# Patient Record
Sex: Female | Born: 1950 | Race: White | Hispanic: No | Smoking: Current every day smoker
Health system: Southern US, Community
[De-identification: ages and names within clinical notes are randomized; demographics above are authoritative.]

## PROBLEM LIST (undated history)

## (undated) DIAGNOSIS — Z8719 Personal history of other diseases of the digestive system: Secondary | ICD-10-CM

## (undated) DIAGNOSIS — G8929 Other chronic pain: Secondary | ICD-10-CM

## (undated) DIAGNOSIS — I2119 ST elevation (STEMI) myocardial infarction involving other coronary artery of inferior wall: Secondary | ICD-10-CM

## (undated) DIAGNOSIS — T4145XA Adverse effect of unspecified anesthetic, initial encounter: Secondary | ICD-10-CM

## (undated) DIAGNOSIS — M25559 Pain in unspecified hip: Secondary | ICD-10-CM

## (undated) DIAGNOSIS — I739 Peripheral vascular disease, unspecified: Secondary | ICD-10-CM

## (undated) DIAGNOSIS — T8859XA Other complications of anesthesia, initial encounter: Secondary | ICD-10-CM

## (undated) DIAGNOSIS — I2510 Atherosclerotic heart disease of native coronary artery without angina pectoris: Secondary | ICD-10-CM

## (undated) DIAGNOSIS — E7800 Pure hypercholesterolemia, unspecified: Secondary | ICD-10-CM

## (undated) DIAGNOSIS — R0789 Other chest pain: Secondary | ICD-10-CM

## (undated) DIAGNOSIS — R0602 Shortness of breath: Secondary | ICD-10-CM

## (undated) DIAGNOSIS — F419 Anxiety disorder, unspecified: Secondary | ICD-10-CM

## (undated) DIAGNOSIS — H269 Unspecified cataract: Secondary | ICD-10-CM

## (undated) DIAGNOSIS — I10 Essential (primary) hypertension: Secondary | ICD-10-CM

## (undated) DIAGNOSIS — K219 Gastro-esophageal reflux disease without esophagitis: Secondary | ICD-10-CM

## (undated) DIAGNOSIS — M1990 Unspecified osteoarthritis, unspecified site: Secondary | ICD-10-CM

## (undated) HISTORY — PX: BREAST SURGERY: SHX581

## (undated) HISTORY — PX: APPENDECTOMY: SHX54

## (undated) HISTORY — PX: TONSILLECTOMY: SUR1361

## (undated) HISTORY — PX: CATARACT EXTRACTION: SUR2

## (undated) HISTORY — PX: TUBAL LIGATION: SHX77

## (undated) HISTORY — DX: Atherosclerotic heart disease of native coronary artery without angina pectoris: I25.10

## (undated) HISTORY — PX: CARDIAC CATHETERIZATION: SHX172

## (undated) HISTORY — PX: VULVECTOMY: SHX1086

---

## 1999-04-08 ENCOUNTER — Other Ambulatory Visit: Admission: RE | Admit: 1999-04-08 | Discharge: 1999-04-08 | Payer: Self-pay | Admitting: Obstetrics and Gynecology

## 2006-07-13 ENCOUNTER — Ambulatory Visit (HOSPITAL_COMMUNITY): Admission: RE | Admit: 2006-07-13 | Discharge: 2006-07-13 | Payer: Self-pay | Admitting: Pediatrics

## 2006-08-08 ENCOUNTER — Emergency Department (HOSPITAL_COMMUNITY): Admission: EM | Admit: 2006-08-08 | Discharge: 2006-08-09 | Payer: Self-pay | Admitting: Emergency Medicine

## 2006-08-11 ENCOUNTER — Ambulatory Visit: Payer: Self-pay | Admitting: Cardiology

## 2006-09-20 ENCOUNTER — Ambulatory Visit: Payer: Self-pay | Admitting: Cardiology

## 2006-09-20 LAB — CONVERTED CEMR LAB
ALT: 21 units/L (ref 0–40)
AST: 16 units/L (ref 0–37)
Bilirubin, Direct: 0.1 mg/dL (ref 0.0–0.3)
Cholesterol: 244 mg/dL (ref 0–200)
Total Protein: 6.3 g/dL (ref 6.0–8.3)

## 2006-09-22 ENCOUNTER — Ambulatory Visit: Payer: Self-pay | Admitting: Cardiology

## 2007-03-15 ENCOUNTER — Ambulatory Visit: Payer: Self-pay | Admitting: Internal Medicine

## 2008-03-26 ENCOUNTER — Encounter: Admission: RE | Admit: 2008-03-26 | Discharge: 2008-03-26 | Payer: Self-pay | Admitting: Gastroenterology

## 2009-04-07 ENCOUNTER — Encounter: Admission: RE | Admit: 2009-04-07 | Discharge: 2009-04-07 | Payer: Self-pay | Admitting: Internal Medicine

## 2011-01-21 NOTE — Assessment & Plan Note (Signed)
[facility]ALTHCARE                            CARDIOLOGY OFFICE NOTE   Kristina, Ward                       MRN:          [id number]  DATE:09/22/2006                            DOB:          1951/08/18    CLINICAL HISTORY:  [name] is 60 years old and returned for follow up,  management of hypertension and hyperlipidemia.  I saw [name] the first time  in December on referral from [facility] Emergency Department for  __________ [name] was started on metoprolol at that time.   When I saw [name] [name] had [name] had a markedly positive risk profile for  vascular disease.  [name] is a smoker with hypertension, hyperlipidemia and  a strong family history for coronary heart disease.   After some __________ .   We continued [name] on [name] Toprol, added aspirin __________ .   PAST MEDICAL HISTORY:  Significant for:  1. Hyperlipidemia.  2. Appendectomy.  3. Tonsillectomy.  4. Tubal ligation.   CURRENT MEDICATIONS:  Metoprolol 50 mg daily.  __________ .   SOCIAL HISTORY:  [name] is single and has 3 children.  [name] works in  [employer]  __________ .   PHYSICAL EXAMINATION:  The blood pressure was 120/80, the pulse 72 and  regular.  There was no vein distention.  Carotid pulses were full.  CHEST:  Clear.  CARDIAC RHYTHM:  Regular.  HEART SOUNDS:  Normal, there were no murmurs or gallops.  ABDOMEN:  Soft with normal bowel sounds.  There was no  hepatosplenomegaly.  Peripheral pulses were full, there was no  peripheral edema.   IMPRESSION:  1. Hypertension, now under good control.  2. Hyperlipidemia, not well controlled.  3. Chronic smoker.  4. Positive family history of coronary heart disease.  5. __________ left arm pain and numbness __________ .   RECOMMENDATIONS:  Will plan to continue Ms. Coke on Toprol-XL 50 mg once  a day for [name] blood pressure.  I have encouraged [name] to take __________  [facility] but we hope [name] may be able to get established at  the [facility].  [name] also needs evaluation of [name] left arm pain which  I think might be related to cervical spine disease.  I will see if I can  make a referral to Dr. Roanna Epley for evaluation of this and also to  help [name] get established with primary care in the [facility].   I will plan to see [name] back on an as needed basis.     Bruce Elvera Lennox Juanda Chance, MD, [facility]  Electronically Signed    BRB/MedQ  DD: 09/22/2006  DT: 09/22/2006  Job #: [id number]   cc:   Sibyl Parr. Darrick Penna, M.D.
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
## 2011-10-26 ENCOUNTER — Encounter (HOSPITAL_COMMUNITY): Payer: Self-pay

## 2011-10-26 ENCOUNTER — Other Ambulatory Visit: Payer: Self-pay

## 2011-10-26 ENCOUNTER — Emergency Department (HOSPITAL_COMMUNITY): Payer: No Typology Code available for payment source

## 2011-10-26 ENCOUNTER — Emergency Department (HOSPITAL_COMMUNITY)
Admission: EM | Admit: 2011-10-26 | Discharge: 2011-10-26 | Disposition: A | Discharge status: Home / Self Care | Payer: No Typology Code available for payment source | Attending: Emergency Medicine | Admitting: Emergency Medicine

## 2011-10-26 DIAGNOSIS — R209 Unspecified disturbances of skin sensation: Secondary | ICD-10-CM | POA: Insufficient documentation

## 2011-10-26 DIAGNOSIS — Z79899 Other long term (current) drug therapy: Secondary | ICD-10-CM | POA: Insufficient documentation

## 2011-10-26 DIAGNOSIS — H539 Unspecified visual disturbance: Secondary | ICD-10-CM | POA: Insufficient documentation

## 2011-10-26 DIAGNOSIS — I10 Essential (primary) hypertension: Secondary | ICD-10-CM | POA: Insufficient documentation

## 2011-10-26 DIAGNOSIS — R202 Paresthesia of skin: Secondary | ICD-10-CM

## 2011-10-26 DIAGNOSIS — F17200 Nicotine dependence, unspecified, uncomplicated: Secondary | ICD-10-CM | POA: Insufficient documentation

## 2011-10-26 HISTORY — DX: Essential (primary) hypertension: I10

## 2011-10-26 LAB — POCT I-STAT, CHEM 8
Calcium, Ion: 1.09 mmol/L — ABNORMAL LOW (ref 1.12–1.32)
Glucose, Bld: 90 mg/dL (ref 70–99)
HCT: 45 % (ref 36.0–46.0)
Hemoglobin: 15.3 g/dL — ABNORMAL HIGH (ref 12.0–15.0)
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
## 2011-10-26 NOTE — ED Notes (Signed)
Pt. Is still in MRI and is unable to do the EKG at this time.
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
## 2011-10-26 NOTE — ED Notes (Signed)
Pt given discharge instructions and verb understanding, amb indep with steady gait, to discharge window
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
## 2011-11-23 ENCOUNTER — Other Ambulatory Visit: Payer: Self-pay | Admitting: Family Medicine

## 2011-11-23 MED ORDER — LISINOPRIL-HYDROCHLOROTHIAZIDE 10-12.5 MG PO TABS: 1.0000 | ORAL_TABLET | Freq: Every day | ORAL | Status: DC

## 2011-11-29 ENCOUNTER — Other Ambulatory Visit: Payer: Self-pay | Admitting: Physician Assistant

## 2012-01-04 ENCOUNTER — Other Ambulatory Visit: Payer: Self-pay | Admitting: Physician Assistant

## 2012-01-04 DIAGNOSIS — Z0271 Encounter for disability determination: Secondary | ICD-10-CM

## 2012-02-08 ENCOUNTER — Ambulatory Visit: Payer: Self-pay | Admitting: Family Medicine

## 2012-02-08 VITALS — BP 143/84 | HR 101 | Temp 97.9°F | Resp 16 | Ht 63.5 in | Wt 184.2 lb

## 2012-02-08 DIAGNOSIS — F419 Anxiety disorder, unspecified: Secondary | ICD-10-CM

## 2012-02-08 DIAGNOSIS — I10 Essential (primary) hypertension: Secondary | ICD-10-CM

## 2012-02-08 LAB — COMPREHENSIVE METABOLIC PANEL
ALT: 21 U/L (ref 0–35)
AST: 17 U/L (ref 0–37)
Creat: 0.66 mg/dL (ref 0.50–1.10)
Total Bilirubin: 0.4 mg/dL (ref 0.3–1.2)

## 2012-02-08 MED ORDER — LISINOPRIL-HYDROCHLOROTHIAZIDE 10-12.5 MG PO TABS: 1.0000 | ORAL_TABLET | Freq: Every day | ORAL | Status: DC

## 2012-02-08 MED ORDER — ALPRAZOLAM 0.5 MG PO TABS: 0.2500 mg | ORAL_TABLET | Freq: Every day | ORAL | Status: DC | PRN

## 2012-02-08 NOTE — Progress Notes (Signed)
  Subjective:    Patient ID: [name], female    DOB: [birth year], 61 y.o.   MRN: [id number]  HPI 61 yo female with anxiety, HTN, and GERD here for medication refills.  Last cmet done 1 year ago.  Needs lisinopril/hctz and xanax.  Out of BP meds     Review of Systems Negative except as per HPI     Objective:   Physical Exam  Constitutional: She appears well-developed and well-nourished.  Cardiovascular: Normal rate, regular rhythm, normal heart sounds and intact distal pulses.   No murmur heard. Pulmonary/Chest: Effort normal and breath sounds normal.  Neurological: She is alert.  Skin: Skin is warm and dry.          Assessment & Plan:  HTN - refilled.  Check cmet  Anxiety - xanax refilled.  Per patient, uses only a few times a week, if that.

## 2012-03-23 ENCOUNTER — Encounter (HOSPITAL_COMMUNITY): Payer: Self-pay | Admitting: *Deleted

## 2012-03-23 ENCOUNTER — Emergency Department (HOSPITAL_COMMUNITY)
Admission: EM | Admit: 2012-03-23 | Discharge: 2012-03-23 | Disposition: A | Discharge status: Home / Self Care | Payer: Self-pay | Attending: Emergency Medicine | Admitting: Emergency Medicine

## 2012-03-23 ENCOUNTER — Emergency Department (HOSPITAL_COMMUNITY): Payer: Self-pay

## 2012-03-23 DIAGNOSIS — K219 Gastro-esophageal reflux disease without esophagitis: Secondary | ICD-10-CM | POA: Insufficient documentation

## 2012-03-23 DIAGNOSIS — K2990 Gastroduodenitis, unspecified, without bleeding: Secondary | ICD-10-CM | POA: Insufficient documentation

## 2012-03-23 DIAGNOSIS — I10 Essential (primary) hypertension: Secondary | ICD-10-CM | POA: Insufficient documentation

## 2012-03-23 DIAGNOSIS — K2970 Gastritis, unspecified, without bleeding: Secondary | ICD-10-CM | POA: Insufficient documentation

## 2012-03-23 DIAGNOSIS — F17200 Nicotine dependence, unspecified, uncomplicated: Secondary | ICD-10-CM | POA: Insufficient documentation

## 2012-03-23 DIAGNOSIS — Z882 Allergy status to sulfonamides status: Secondary | ICD-10-CM | POA: Insufficient documentation

## 2012-03-23 DIAGNOSIS — Z880 Allergy status to penicillin: Secondary | ICD-10-CM | POA: Insufficient documentation

## 2012-03-23 HISTORY — DX: Gastro-esophageal reflux disease without esophagitis: K21.9

## 2012-03-23 LAB — COMPREHENSIVE METABOLIC PANEL
AST: 21 U/L (ref 0–37)
BUN: 19 mg/dL (ref 6–23)
CO2: 23 mEq/L (ref 19–32)
Calcium: 9.8 mg/dL (ref 8.4–10.5)
Creatinine, Ser: 0.71 mg/dL (ref 0.50–1.10)
GFR calc Af Amer: >90 mL/min (ref >90)
GFR calc non Af Amer: 90 mL/min — ABNORMAL LOW (ref >90)
Glucose, Bld: 168 mg/dL — ABNORMAL HIGH (ref 70–99)

## 2012-03-23 LAB — URINALYSIS, ROUTINE W REFLEX MICROSCOPIC
Leukocytes, UA: NEGATIVE
Nitrite: NEGATIVE
Protein, ur: NEGATIVE mg/dL
Urobilinogen, UA: 0.2 mg/dL (ref 0.0–1.0)

## 2012-03-23 LAB — CBC WITH DIFFERENTIAL/PLATELET
Basophils Absolute: 0 10*3/uL (ref 0.0–0.1)
Eosinophils Relative: 2 % (ref 0–5)
HCT: 45.4 % (ref 36.0–46.0)
Lymphocytes Relative: 41 % (ref 12–46)
MCHC: 34.1 g/dL (ref 30.0–36.0)
MCV: 92.3 fL (ref 78.0–100.0)
Monocytes Absolute: 0.8 10*3/uL (ref 0.1–1.0)
RDW: 13.8 % (ref 11.5–15.5)
WBC: 12 10*3/uL — ABNORMAL HIGH (ref 4.0–10.5)

## 2012-03-23 LAB — LIPASE, BLOOD: Lipase: 47 U/L (ref 11–59)

## 2012-03-23 LAB — URINE MICROSCOPIC-ADD ON

## 2012-03-23 MED ORDER — GI COCKTAIL ~~LOC~~
30.0000 mL | Freq: Once | ORAL | Status: AC
  Administered 2012-03-23: 30 mL via ORAL
  Filled 2012-03-23: qty 30

## 2012-03-23 MED ORDER — ONDANSETRON HCL 4 MG/2ML IJ SOLN
4.0000 mg | Freq: Once | INTRAMUSCULAR | Status: AC
  Administered 2012-03-23: 4 mg via INTRAVENOUS
  Filled 2012-03-23: qty 2

## 2012-03-23 MED ORDER — PANTOPRAZOLE SODIUM 40 MG IV SOLR
40.0000 mg | Freq: Once | INTRAVENOUS | Status: AC
  Administered 2012-03-23: 40 mg via INTRAVENOUS
  Filled 2012-03-23: qty 40

## 2012-03-23 MED ORDER — SODIUM CHLORIDE 0.9 % IV BOLUS (SEPSIS)
1000.0000 mL | Freq: Once | INTRAVENOUS | Status: AC
  Administered 2012-03-23: 1000 mL via INTRAVENOUS

## 2012-03-23 MED ORDER — MORPHINE SULFATE 4 MG/ML IJ SOLN
4.0000 mg | Freq: Once | INTRAMUSCULAR | Status: DC
  Filled 2012-03-23: qty 1

## 2012-03-23 MED ORDER — MAGIC MOUTHWASH W/LIDOCAINE: 10.0000 mL | Freq: Three times a day (TID) | ORAL | Status: DC | PRN

## 2012-03-23 NOTE — ED Notes (Signed)
Informed pt of needing a urine sample.  Pt stated that she went to the bathroom prior to getting to the emergency room.

## 2012-03-23 NOTE — ED Provider Notes (Signed)
History     CSN: [id number]  Arrival date & time 03/23/12  0118   First MD Initiated Contact with Patient 03/23/12 0130      Chief Complaint  Patient presents with  . Abdominal Pain   HPI  Tried by the patient. Patient is a 61 year old female history of hypertension, GERD, appendectomy who presents with complaints of upper abdominal pains for the past 3-4 hours. Patient reports the pain woke her up from sleep. Pain is described as a burning pain in the epigastric and right upper quadrant areas. Patient also reports having radiation around to the back. Pain is much worse in any other pains previously. Symptoms were associated with one small episode of vomiting. She does report having some similar discomforts a burning sensations in the past related to GERD. Patient has been taking Prilosec and Zantac daily. She has no prior history of PUD. Patient does report having a late meal prior to laying down. She denies any fever, chills, sweats, diarrhea or constipation.    Past Medical History  Diagnosis Date  . Hypertension   . GERD (gastroesophageal reflux disease)     Past Surgical History  Procedure Date  . Tonsillectomy   . Appendectomy   . Vulvectomy   . Breast surgery     No family history on file.  History  Substance Use Topics  . Smoking status: Current Everyday Smoker -- 0.5 packs/day  . Smokeless tobacco: Not on file  . Alcohol Use:     OB History    Grav Para Term Preterm Abortions TAB SAB Ect Mult Living                  Review of Systems  Constitutional: Negative for fever and chills.  Gastrointestinal: Positive for nausea, vomiting and abdominal pain. Negative for diarrhea and constipation.  Genitourinary: Negative for dysuria, frequency, hematuria and flank pain.  All other systems reviewed and are negative.    Allergies  Ciprofloxacin; Erythromycin; Penicillins; and Sulfa antibiotics  Home Medications   Current Outpatient Rx  Name Route Sig  Dispense Refill  . ACETAMINOPHEN 500 MG PO TABS Oral Take 500 mg by mouth every 6 (six) hours as needed. pain    . ALPRAZOLAM 0.5 MG PO TABS Oral Take 0.5 tablets (0.25 mg total) by mouth daily as needed. anxiety 30 tablet 3  . IBUPROFEN 100 MG PO TABS Oral Take 100 mg by mouth every 6 (six) hours as needed. pain    . LISINOPRIL-HYDROCHLOROTHIAZIDE 10-12.5 MG PO TABS Oral Take 1 tablet by mouth daily. 90 tablet 3  . OMEPRAZOLE 20 MG PO CPDR Oral Take 20 mg by mouth 2 (two) times daily.    Marland Kitchen RANITIDINE HCL 75 MG PO TABS Oral Take 75 mg by mouth 2 (two) times daily.      BP 162/94  Pulse 61  Temp 97.5 F (36.4 C)  Resp 20  SpO2 100%  Physical Exam  Nursing note and vitals reviewed. Constitutional: She is oriented to person, place, and time. She appears well-developed and well-nourished. No distress.  HENT:  Head: Normocephalic.  Cardiovascular: Normal rate and regular rhythm.   Pulmonary/Chest: Effort normal and breath sounds normal. No respiratory distress. She has no wheezes.  Abdominal: Soft. There is tenderness in the right upper quadrant and epigastric area. There is no rebound, no guarding, no CVA tenderness, no tenderness at McBurney's point and negative Murphy's sign.  Neurological: She is alert and oriented to person, place, and time.  Skin:  Skin is warm and dry.  Psychiatric: She has a normal mood and affect. Her behavior is normal.    ED Course  Procedures  Results for orders placed during the hospital encounter of 03/23/12  CBC WITH DIFFERENTIAL      Component Value Range   WBC 12.0 (*) 4.0 - 10.5 K/uL   RBC 4.92  3.87 - 5.11 MIL/uL   Hemoglobin 15.5 (*) 12.0 - 15.0 g/dL   HCT 16.1  09.6 - 04.5 %   MCV 92.3  78.0 - 100.0 fL   MCH 31.5  26.0 - 34.0 pg   MCHC 34.1  30.0 - 36.0 g/dL   RDW 40.9  81.1 - 91.4 %   Platelets 278  150 - 400 K/uL   Neutrophils Relative 51  43 - 77 %   Neutro Abs 6.1  1.7 - 7.7 K/uL   Lymphocytes Relative 41  12 - 46 %   Lymphs Abs 4.9  (*) 0.7 - 4.0 K/uL   Monocytes Relative 7  3 - 12 %   Monocytes Absolute 0.8  0.1 - 1.0 K/uL   Eosinophils Relative 2  0 - 5 %   Eosinophils Absolute 0.2  0.0 - 0.7 K/uL   Basophils Relative 0  0 - 1 %   Basophils Absolute 0.0  0.0 - 0.1 K/uL  COMPREHENSIVE METABOLIC PANEL      Component Value Range   Sodium 142  135 - 145 mEq/L   Potassium 4.3  3.5 - 5.1 mEq/L   Chloride 105  96 - 112 mEq/L   CO2 23  19 - 32 mEq/L   Glucose, Bld 168 (*) 70 - 99 mg/dL   BUN 19  6 - 23 mg/dL   Creatinine, Ser 7.82  0.50 - 1.10 mg/dL   Calcium 9.8  8.4 - 95.6 mg/dL   Total Protein 7.0  6.0 - 8.3 g/dL   Albumin 3.7  3.5 - 5.2 g/dL   AST 21  0 - 37 U/L   ALT 22  0 - 35 U/L   Alkaline Phosphatase 136 (*) 39 - 117 U/L   Total Bilirubin 0.2 (*) 0.3 - 1.2 mg/dL   GFR calc non Af Amer 90 (*) >90 mL/min   GFR calc Af Amer >90  >90 mL/min  LIPASE, BLOOD      Component Value Range   Lipase 47  11 - 59 U/L  URINALYSIS, ROUTINE W REFLEX MICROSCOPIC      Component Value Range   Color, Urine YELLOW  YELLOW   APPearance CLEAR  CLEAR   Specific Gravity, Urine 1.027  1.005 - 1.030   pH 5.0  5.0 - 8.0   Glucose, UA NEGATIVE  NEGATIVE mg/dL   Hgb urine dipstick TRACE (*) NEGATIVE   Bilirubin Urine NEGATIVE  NEGATIVE   Ketones, ur NEGATIVE  NEGATIVE mg/dL   Protein, ur NEGATIVE  NEGATIVE mg/dL   Urobilinogen, UA 0.2  0.0 - 1.0 mg/dL   Nitrite NEGATIVE  NEGATIVE   Leukocytes, UA NEGATIVE  NEGATIVE  URINE MICROSCOPIC-ADD ON      Component Value Range   Squamous Epithelial / LPF FEW (*) RARE   WBC, UA 0-2  <3 WBC/hpf   RBC / HPF 0-2  <3 RBC/hpf   Bacteria, UA FEW (*) RARE   Crystals CA OXALATE CRYSTALS (*) NEGATIVE   Urine-Other MUCOUS PRESENT        US Abdomen Complete  03/23/2012  *RADIOLOGY REPORT*  Clinical Data:  Abdominal pain.  Technically limited study likely due to body habitus.  COMPLETE ABDOMINAL ULTRASOUND  Comparison:  None.  Findings:  Gallbladder:  Focal nonshadowing filling defect in the  gallbladder fundus likely represents a small polyp.  This measures about 3 mm diameter.  No gallstones, gallbladder wall thickening, or edema. Negative Murphy's sign.  Common bile duct:  Normal caliber with measured diameter of 5 mm.  Liver:  Diffusely increased hepatic echotexture suggesting diffuse fatty infiltration.  No focal lesions.  IVC:  Appears normal.  Pancreas:  No focal abnormality seen.  Spleen:  Spleen length measures 8.9 cm.  Normal parenchymal echotexture.  Right Kidney:  The right kidney measures right kidney measures 11.7 cm length.  No hydronephrosis.  Left Kidney:  Left kidney measures 12.7 cm length.  No hydronephrosis.  Abdominal aorta:  Segmental visualization of the abdominal aorta demonstrates no aneurysm in the visualized area.  IMPRESSION: Small gallbladder polyp.  Diffuse fatty infiltration of the liver. Examination is otherwise unremarkable.  Original Report Authenticated By: Marlon Pel, M.D.     1. Gastritis       MDM  1:50AM patient seen and evaluated. Workup initiated with CBC, CMP and lipase urinalysis to rule out biliary cause. Pain medication ordered with morphine and Zofran.   Patient does not wish to have morphine for her pain at this time. Patient did take a GI cocktail and Protonix. Patient reports feeling significant improvements approximately 15 minutes after these medications.  Patient has continued to do well without complaints of additional pain. Her ultrasound today does not show any acute concerning findings. There is a small gallbladder polyp present. At this time suspect her symptoms are more likely cause from gastritis. Patient has been instructed to followup with Dr. Loreta Ave for continued evaluation and treatments.     Angus Seller, [place] 03/23/12 [phone]

## 2012-03-23 NOTE — ED Notes (Signed)
Pt reports having emesis and abdominal pain in mid upper abdomen that started at 0100 today. Pt has hx of IBS.

## 2012-03-23 NOTE — ED Notes (Signed)
Pt c/o upper abd pain; burning to right upper abd; vomiting

## 2012-03-26 ENCOUNTER — Telehealth: Payer: Self-pay

## 2012-03-26 MED ORDER — OMEPRAZOLE 20 MG PO CPDR: 20.0000 mg | DELAYED_RELEASE_CAPSULE | Freq: Two times a day (BID) | ORAL | Status: DC

## 2012-03-26 NOTE — Telephone Encounter (Signed)
Rx sent to pharmacy   

## 2012-03-26 NOTE — Telephone Encounter (Addendum)
PT NEED NEXXIUM  MEDICINE FILLED,  THIS IS A NEW REQUEST AND SHE REFUSED TO CALL HER PHARMACY, HER INSURANCE WILL COVER A NEW WITH AN $18.00 COPAY PLEASE CALL [phone]    [facility] ON [address]

## 2012-03-26 NOTE — Telephone Encounter (Signed)
Please

## 2012-03-27 NOTE — ED Provider Notes (Signed)
Medical screening examination/treatment/procedure(s) were performed by non-physician practitioner and as supervising physician I was immediately available for consultation/collaboration.  Olivia Mackie, MD 03/27/12 [phone]

## 2012-03-29 ENCOUNTER — Telehealth: Payer: Self-pay

## 2012-03-29 NOTE — Telephone Encounter (Signed)
PT STATES RECENT GENERIC PRIILOSEC WILL NOT WORK FOR HER,SHE MUST HAVE NEXIUM    BEST PHONE [phone]   Marlinda Mike MKT

## 2012-03-30 MED ORDER — ESOMEPRAZOLE MAGNESIUM 40 MG PO CPDR: 40.0000 mg | DELAYED_RELEASE_CAPSULE | Freq: Every day | ORAL | Status: DC

## 2012-03-30 NOTE — Telephone Encounter (Signed)
Sent correct Rx in for pt.

## 2012-04-01 NOTE — Telephone Encounter (Signed)
ADVISED PT THAT NEXIUM WAS SENT IN

## 2012-05-06 DIAGNOSIS — I2119 ST elevation (STEMI) myocardial infarction involving other coronary artery of inferior wall: Secondary | ICD-10-CM

## 2012-05-06 HISTORY — DX: ST elevation (STEMI) myocardial infarction involving other coronary artery of inferior wall: I21.19

## 2012-05-24 ENCOUNTER — Encounter (HOSPITAL_COMMUNITY): Payer: Self-pay | Admitting: *Deleted

## 2012-05-24 ENCOUNTER — Ambulatory Visit (HOSPITAL_COMMUNITY): Admit: 2012-05-24 | Payer: Self-pay | Admitting: Cardiology

## 2012-05-24 ENCOUNTER — Encounter (HOSPITAL_COMMUNITY)
Admission: EM | Disposition: A | Discharge status: Home / Self Care | Payer: Self-pay | Source: Home / Self Care | Attending: Cardiology

## 2012-05-24 ENCOUNTER — Inpatient Hospital Stay (HOSPITAL_COMMUNITY)
Admission: EM | Admit: 2012-05-24 | Discharge: 2012-05-27 | DRG: 246 | Disposition: A | Discharge status: Home / Self Care | Payer: Self-pay | Attending: Cardiology | Admitting: Cardiology

## 2012-05-24 DIAGNOSIS — J449 Chronic obstructive pulmonary disease, unspecified: Secondary | ICD-10-CM | POA: Diagnosis present

## 2012-05-24 DIAGNOSIS — I469 Cardiac arrest, cause unspecified: Secondary | ICD-10-CM | POA: Diagnosis present

## 2012-05-24 DIAGNOSIS — Z880 Allergy status to penicillin: Secondary | ICD-10-CM

## 2012-05-24 DIAGNOSIS — D62 Acute posthemorrhagic anemia: Secondary | ICD-10-CM | POA: Diagnosis not present

## 2012-05-24 DIAGNOSIS — F17200 Nicotine dependence, unspecified, uncomplicated: Secondary | ICD-10-CM | POA: Diagnosis present

## 2012-05-24 DIAGNOSIS — E7800 Pure hypercholesterolemia, unspecified: Secondary | ICD-10-CM | POA: Diagnosis present

## 2012-05-24 DIAGNOSIS — I10 Essential (primary) hypertension: Secondary | ICD-10-CM | POA: Diagnosis present

## 2012-05-24 DIAGNOSIS — Z882 Allergy status to sulfonamides status: Secondary | ICD-10-CM

## 2012-05-24 DIAGNOSIS — I2119 ST elevation (STEMI) myocardial infarction involving other coronary artery of inferior wall: Principal | ICD-10-CM | POA: Diagnosis present

## 2012-05-24 DIAGNOSIS — R570 Cardiogenic shock: Secondary | ICD-10-CM

## 2012-05-24 DIAGNOSIS — J4489 Other specified chronic obstructive pulmonary disease: Secondary | ICD-10-CM | POA: Diagnosis present

## 2012-05-24 DIAGNOSIS — Z9089 Acquired absence of other organs: Secondary | ICD-10-CM

## 2012-05-24 DIAGNOSIS — K219 Gastro-esophageal reflux disease without esophagitis: Secondary | ICD-10-CM | POA: Diagnosis present

## 2012-05-24 DIAGNOSIS — I4901 Ventricular fibrillation: Secondary | ICD-10-CM | POA: Diagnosis present

## 2012-05-24 DIAGNOSIS — Z8249 Family history of ischemic heart disease and other diseases of the circulatory system: Secondary | ICD-10-CM

## 2012-05-24 HISTORY — PX: LEFT HEART CATHETERIZATION WITH CORONARY ANGIOGRAM: SHX5451

## 2012-05-24 LAB — COMPREHENSIVE METABOLIC PANEL
BUN: 8 mg/dL (ref 6–23)
CO2: 18 mEq/L — ABNORMAL LOW (ref 19–32)
Calcium: 8.1 mg/dL — ABNORMAL LOW (ref 8.4–10.5)
GFR calc Af Amer: >90 mL/min (ref >90)
GFR calc non Af Amer: >90 mL/min (ref >90)
Glucose, Bld: 133 mg/dL — ABNORMAL HIGH (ref 70–99)
Total Protein: 5.6 g/dL — ABNORMAL LOW (ref 6.0–8.3)

## 2012-05-24 LAB — URINALYSIS, ROUTINE W REFLEX MICROSCOPIC
Bilirubin Urine: NEGATIVE
Glucose, UA: NEGATIVE mg/dL
Ketones, ur: NEGATIVE mg/dL
Leukocytes, UA: NEGATIVE
pH: 5.5 (ref 5.0–8.0)

## 2012-05-24 LAB — PROTIME-INR
INR: 1.24 (ref 0.00–1.49)
Prothrombin Time: 15.4 seconds — ABNORMAL HIGH (ref 11.6–15.2)

## 2012-05-24 LAB — POCT I-STAT TROPONIN I: Troponin i, poc: 0.01 ng/mL (ref 0.00–0.08)

## 2012-05-24 LAB — CBC WITH DIFFERENTIAL/PLATELET
Basophils Absolute: 0 10*3/uL (ref 0.0–0.1)
Basophils Relative: 0 % (ref 0–1)
HCT: 39.5 % (ref 36.0–46.0)
Hemoglobin: 13.4 g/dL (ref 12.0–15.0)
Lymphocytes Relative: 6 % — ABNORMAL LOW (ref 12–46)
Monocytes Absolute: 0.7 10*3/uL (ref 0.1–1.0)
Monocytes Relative: 4 % (ref 3–12)
Neutro Abs: 16.7 10*3/uL — ABNORMAL HIGH (ref 1.7–7.7)
Neutrophils Relative %: 90 % — ABNORMAL HIGH (ref 43–77)
RDW: 14.1 % (ref 11.5–15.5)
WBC: 18.6 10*3/uL — ABNORMAL HIGH (ref 4.0–10.5)

## 2012-05-24 LAB — POCT I-STAT, CHEM 8
BUN: 9 mg/dL (ref 6–23)
Calcium, Ion: 1.05 mmol/L — ABNORMAL LOW (ref 1.13–1.30)
Chloride: 109 mEq/L (ref 96–112)
Creatinine, Ser: 0.6 mg/dL (ref 0.50–1.10)
Glucose, Bld: 197 mg/dL — ABNORMAL HIGH (ref 70–99)
TCO2: 17 mmol/L (ref 0–100)

## 2012-05-24 LAB — CBC
HCT: 34.2 % — ABNORMAL LOW (ref 36.0–46.0)
Hemoglobin: 11.3 g/dL — ABNORMAL LOW (ref 12.0–15.0)
MCHC: 33 g/dL (ref 30.0–36.0)
RBC: 3.72 MIL/uL — ABNORMAL LOW (ref 3.87–5.11)

## 2012-05-24 LAB — TROPONIN I
Troponin I: >20 ng/mL (ref <0.30)
Troponin I: >20 ng/mL (ref <0.30)

## 2012-05-24 LAB — POCT ACTIVATED CLOTTING TIME: Activated Clotting Time: 424 seconds

## 2012-05-24 LAB — URINE MICROSCOPIC-ADD ON

## 2012-05-24 LAB — TSH: TSH: 1.005 u[IU]/mL (ref 0.350–4.500)

## 2012-05-24 SURGERY — LEFT HEART CATHETERIZATION WITH CORONARY ANGIOGRAM
Anesthesia: LOCAL

## 2012-05-24 MED ORDER — ONDANSETRON HCL 4 MG/2ML IJ SOLN: 4.0000 mg | Freq: Four times a day (QID) | INTRAMUSCULAR | Status: DC | PRN

## 2012-05-24 MED ORDER — ONDANSETRON HCL 4 MG/2ML IJ SOLN
INTRAMUSCULAR | Status: AC
  Filled 2012-05-24: qty 2

## 2012-05-24 MED ORDER — ASPIRIN 81 MG PO CHEW: 81.0000 mg | CHEWABLE_TABLET | Freq: Every day | ORAL | Status: DC

## 2012-05-24 MED ORDER — PANTOPRAZOLE SODIUM 40 MG PO TBEC: 40.0000 mg | DELAYED_RELEASE_TABLET | Freq: Every day | ORAL | Status: DC

## 2012-05-24 MED ORDER — HEPARIN (PORCINE) IN NACL 2-0.9 UNIT/ML-% IJ SOLN
INTRAMUSCULAR | Status: AC
  Filled 2012-05-24: qty 1500

## 2012-05-24 MED ORDER — NITROGLYCERIN IN D5W 200-5 MCG/ML-% IV SOLN: 3.0000 ug/min | INTRAVENOUS | Status: DC

## 2012-05-24 MED ORDER — NITROGLYCERIN IN D5W 200-5 MCG/ML-% IV SOLN
INTRAVENOUS | Status: AC
  Filled 2012-05-24: qty 250

## 2012-05-24 MED ORDER — ATROPINE SULFATE 1 MG/ML IJ SOLN
INTRAMUSCULAR | Status: AC
  Filled 2012-05-24: qty 1

## 2012-05-24 MED ORDER — NITROGLYCERIN 0.4 MG SL SUBL: 0.4000 mg | SUBLINGUAL_TABLET | SUBLINGUAL | Status: DC | PRN

## 2012-05-24 MED ORDER — EPTIFIBATIDE 75 MG/100ML IV SOLN
INTRAVENOUS | Status: AC
  Filled 2012-05-24: qty 100

## 2012-05-24 MED ORDER — ATROPINE SULFATE 1 MG/ML IJ SOLN
INTRAMUSCULAR | Status: AC
  Filled 2012-05-24: qty 2

## 2012-05-24 MED ORDER — POTASSIUM CHLORIDE CRYS ER 20 MEQ PO TBCR
40.0000 meq | EXTENDED_RELEASE_TABLET | Freq: Once | ORAL | Status: AC
  Administered 2012-05-24: 40 meq via ORAL
  Filled 2012-05-24: qty 1

## 2012-05-24 MED ORDER — LIDOCAINE HCL (PF) 1 % IJ SOLN
INTRAMUSCULAR | Status: AC
  Filled 2012-05-24: qty 30

## 2012-05-24 MED ORDER — ACETAMINOPHEN 325 MG PO TABS: 650.0000 mg | ORAL_TABLET | ORAL | Status: DC | PRN

## 2012-05-24 MED ORDER — ATORVASTATIN CALCIUM 80 MG PO TABS
80.0000 mg | ORAL_TABLET | Freq: Every day | ORAL | Status: DC
  Administered 2012-05-24 – 2012-05-26 (×3): 80 mg via ORAL
  Filled 2012-05-24 (×5): qty 1

## 2012-05-24 MED ORDER — NITROGLYCERIN 0.2 MG/ML ON CALL CATH LAB
INTRAVENOUS | Status: AC
  Filled 2012-05-24: qty 1

## 2012-05-24 MED ORDER — ASPIRIN 300 MG RE SUPP
300.0000 mg | RECTAL | Status: DC
  Filled 2012-05-24: qty 1

## 2012-05-24 MED ORDER — ALPRAZOLAM 0.25 MG PO TABS
0.2500 mg | ORAL_TABLET | Freq: Every day | ORAL | Status: DC | PRN
  Administered 2012-05-24 – 2012-05-26 (×3): 0.25 mg via ORAL
  Filled 2012-05-24 (×3): qty 1

## 2012-05-24 MED ORDER — TICAGRELOR 90 MG PO TABS: 90.0000 mg | ORAL_TABLET | Freq: Two times a day (BID) | ORAL | Status: DC

## 2012-05-24 MED ORDER — ASPIRIN 300 MG RE SUPP
300.0000 mg | RECTAL | Status: AC
  Administered 2012-05-24: 300 mg via RECTAL

## 2012-05-24 MED ORDER — BIVALIRUDIN 250 MG IV SOLR
INTRAVENOUS | Status: AC
  Filled 2012-05-24: qty 250

## 2012-05-24 MED ORDER — ASPIRIN EC 81 MG PO TBEC
81.0000 mg | DELAYED_RELEASE_TABLET | Freq: Every day | ORAL | Status: DC
  Administered 2012-05-25 – 2012-05-27 (×3): 81 mg via ORAL
  Filled 2012-05-24 (×3): qty 1

## 2012-05-24 MED ORDER — FAMOTIDINE 20 MG PO TABS
20.0000 mg | ORAL_TABLET | Freq: Two times a day (BID) | ORAL | Status: DC
  Administered 2012-05-24 – 2012-05-27 (×6): 20 mg via ORAL
  Filled 2012-05-24 (×8): qty 1

## 2012-05-24 MED ORDER — NOREPINEPHRINE BITARTRATE 1 MG/ML IJ SOLN
INTRAMUSCULAR | Status: AC
  Filled 2012-05-24: qty 4

## 2012-05-24 MED ORDER — ASPIRIN 300 MG RE SUPP
RECTAL | Status: AC
  Administered 2012-05-24: 300 mg via RECTAL
  Filled 2012-05-24: qty 1

## 2012-05-24 MED ORDER — MORPHINE SULFATE 2 MG/ML IJ SOLN
2.0000 mg | INTRAMUSCULAR | Status: DC | PRN
  Administered 2012-05-24 (×2): 1 mg via INTRAVENOUS
  Administered 2012-05-25 – 2012-05-26 (×5): 2 mg via INTRAVENOUS
  Filled 2012-05-24 (×6): qty 1

## 2012-05-24 MED ORDER — TICAGRELOR 90 MG PO TABS
90.0000 mg | ORAL_TABLET | Freq: Two times a day (BID) | ORAL | Status: DC
  Administered 2012-05-24 – 2012-05-27 (×7): 90 mg via ORAL
  Filled 2012-05-24 (×10): qty 1

## 2012-05-24 MED ORDER — DOPAMINE-DEXTROSE 3.2-5 MG/ML-% IV SOLN
INTRAVENOUS | Status: AC
  Filled 2012-05-24: qty 250

## 2012-05-24 MED ORDER — SODIUM CHLORIDE 0.9 % IV SOLN
INTRAVENOUS | Status: DC
  Administered 2012-05-24 – 2012-05-25 (×2): via INTRAVENOUS

## 2012-05-24 MED ORDER — POTASSIUM CHLORIDE 10 MEQ/100ML IV SOLN
INTRAVENOUS | Status: AC
  Filled 2012-05-24: qty 100

## 2012-05-24 MED ORDER — ASPIRIN 81 MG PO CHEW: 324.0000 mg | CHEWABLE_TABLET | ORAL | Status: DC

## 2012-05-24 MED ORDER — METOPROLOL TARTRATE 12.5 MG HALF TABLET
12.5000 mg | ORAL_TABLET | Freq: Two times a day (BID) | ORAL | Status: DC
  Administered 2012-05-24 – 2012-05-27 (×6): 12.5 mg via ORAL
  Filled 2012-05-24 (×9): qty 1

## 2012-05-24 MED ORDER — ACETAMINOPHEN 325 MG PO TABS
650.0000 mg | ORAL_TABLET | ORAL | Status: DC | PRN
  Administered 2012-05-24 – 2012-05-27 (×9): 650 mg via ORAL
  Filled 2012-05-24 (×10): qty 2

## 2012-05-24 MED ORDER — VANCOMYCIN HCL IN DEXTROSE 1-5 GM/200ML-% IV SOLN
1000.0000 mg | Freq: Once | INTRAVENOUS | Status: AC
  Administered 2012-05-24: 1000 mg via INTRAVENOUS
  Filled 2012-05-24: qty 200

## 2012-05-24 MED ORDER — HEPARIN BOLUS VIA INFUSION
4000.0000 [IU] | INTRAVENOUS | Status: AC
  Administered 2012-05-24: 4000 [IU] via INTRAVENOUS

## 2012-05-24 MED ORDER — HEPARIN SODIUM (PORCINE) 1000 UNIT/ML IJ SOLN
INTRAMUSCULAR | Status: AC
  Filled 2012-05-24: qty 1

## 2012-05-24 MED ORDER — ASPIRIN 81 MG PO CHEW
CHEWABLE_TABLET | ORAL | Status: AC
  Filled 2012-05-24: qty 4

## 2012-05-24 MED ORDER — POTASSIUM CHLORIDE CRYS ER 20 MEQ PO TBCR
EXTENDED_RELEASE_TABLET | ORAL | Status: AC
  Administered 2012-05-24: 40 meq via ORAL
  Filled 2012-05-24: qty 1

## 2012-05-24 MED ORDER — EPTIFIBATIDE 75 MG/100ML IV SOLN
2.0000 ug/kg/min | INTRAVENOUS | Status: AC
  Administered 2012-05-24 (×3): 2 ug/kg/min via INTRAVENOUS
  Filled 2012-05-24 (×4): qty 100

## 2012-05-24 MED ORDER — DEXTROSE 5 % IV SOLN
300.0000 mg | Freq: Once | INTRAVENOUS | Status: AC
  Administered 2012-05-24: 300 mg via INTRAVENOUS

## 2012-05-24 MED ORDER — TICAGRELOR 90 MG PO TABS
180.0000 mg | ORAL_TABLET | ORAL | Status: AC
  Administered 2012-05-24: 180 mg via ORAL

## 2012-05-24 MED FILL — Medication: Qty: 1 | Status: AC

## 2012-05-24 NOTE — Progress Notes (Signed)
ANTICOAGULATION CONSULT NOTE - Initial Consult  Pharmacy Consult for Integrilin Indication: Post-cath / PCTA stenting  Allergies  Allergen Reactions  . Ciprofloxacin Anaphylaxis  . Erythromycin Nausea And Vomiting  . Penicillins Rash  . Sulfa Antibiotics Rash    Patient Measurements: Height: 5' 3.39" (161 cm) Weight: 184 lb 4.9 oz (83.6 kg) (Per 02/08/12 documentation) IBW/kg (Calculated) : 53.29   Vital Signs: Temp: 97.5 F (36.4 C) (09/19 0307) BP: 93/50 mmHg (09/19 0419) Pulse Rate: 59  (09/19 0419)  Labs: No results found for this basename: HGB:2,HCT:3,PLT:3,APTT:3,LABPROT:3,INR:3,HEPARINUNFRC:3,CREATININE:3,CKTOTAL:3,CKMB:3,TROPONINI:3 in the last 72 hours  Estimated Creatinine Clearance: 74.3 ml/min (by C-G formula based on Cr of 0.71).   Medical History: Past Medical History  Diagnosis Date  . Hypertension   . GERD (gastroesophageal reflux disease)     Medications:  Scheduled:    . amiodarone  300 mg Intravenous Once  . aspirin      . aspirin      . aspirin  300 mg Rectal STAT  . atropine      . bivalirudin      . bivalirudin      . DOPamine      . eptifibatide      . heparin      . heparin      . heparin  4,000 Units Intravenous STAT  . lidocaine      . nitroGLYCERIN      . norepinephrine      . ondansetron      . Ticagrelor  180 mg Oral STAT  . vancomycin  1,000 mg Intravenous Once    Assessment: 61 yo female post-cath / PTCA stenting with Integrilin started post-cath. Pharmacy now consulted to manage Integrilin.  Baseline SrCr pending, SrCr 0.71 from (03/23/12). Baseline CBC pending.   Goal of Therapy:  Monitor platelets by anticoagulation protocol: Yes   Plan:  1. Integrilin 2 mcg/kg/min x 18 hours post-cath (until 01:00 AM on 05/25/12).  2. Follow-up baseline labs. 3. CBC in 8 hours (15:00).   Emeline Gins 05/24/2012,7:11 AM

## 2012-05-24 NOTE — Progress Notes (Signed)
ANTICOAGULATION CO2NSULT NOTE - Follow Up Consult  Pharmacy Consult for Integrilin Indication: Post-cath / PCTA stenting  Allergies  Allergen Reactions  . Ciprofloxacin Anaphylaxis  . Erythromycin Nausea And Vomiting  . Penicillins Rash  . Sulfa Antibiotics Rash    Patient Measurements: Height: 5' 3.39" (161 cm) Weight: 184 lb 4.9 oz (83.6 kg) (Per 02/08/12 documentation) IBW/kg (Calculated) : 53.29    Vital Signs: Temp: 98.1 F (36.7 C) (09/19 0800) Temp src: Oral (09/19 0800) BP: 95/62 mmHg (09/19 1000) Pulse Rate: 89  (09/19 1000)  Labs:  Basename 05/24/12 0800 05/24/12 0750  HGB -- 13.4  HCT -- 39.5  PLT -- 273  APTT 57* --  LABPROT 15.4* --  INR 1.24 --  HEPARINUNFRC -- --  CREATININE -- 0.51  CKTOTAL -- --  CKMB -- --  TROPONINI -- >20.00*    Estimated Creatinine Clearance: 74.3 ml/min (by C-G formula based on Cr of 0.51).   Medications:  Scheduled:    . amiodarone  300 mg Intravenous Once  . aspirin      . aspirin      . aspirin  324 mg Oral NOW   Or  . aspirin  300 mg Rectal NOW  . aspirin EC  81 mg Oral Daily  . aspirin  300 mg Rectal STAT  . atorvastatin  80 mg Oral q1800  . atropine      . atropine      . bivalirudin      . bivalirudin      . DOPamine      . eptifibatide      . heparin      . heparin      . heparin  4,000 Units Intravenous STAT  . lidocaine      . metoprolol tartrate  12.5 mg Oral BID  . nitroGLYCERIN      . norepinephrine      . ondansetron      . pantoprazole  40 mg Oral Q breakfast  . Ticagrelor  180 mg Oral STAT  . Ticagrelor  90 mg Oral BID  . vancomycin  1,000 mg Intravenous Once  . DISCONTD: aspirin  81 mg Oral Daily  . DISCONTD: Ticagrelor  90 mg Oral BID    Assessment: 61 yo female s/p cath/PTCA stenting initiated on Integrilin post cath. Rx consulted to manage Integrilin. CBC returned WNL and Scr 0.51 with CrCl above 70ml/min. No adjustment to Integrilin dose necessary at this time.  Goal of Therapy:   Monitor platelets by anticoagulation protocol: Yes   Plan:  1. Continue Integrilin 2 mcg/kg/min x 18 hours post-cath   2. F/u CBC post Integrilin initiation 3. Monitor s/sx of bleeding  Tiney Rouge, PharmD Candidate  05/24/2012,10:12 AM ----------------------------------------------------------------- I have reviewed and agreed with the assessment and plans of Tiney Rouge, PharmD Candidate  Franchot Erichsen, Pharm.D. Clinical Pharmacist   Pager: [phone] 05/24/2012 10:46 AM

## 2012-05-24 NOTE — ED Provider Notes (Addendum)
History     CSN: [id number]  Arrival date & time 05/24/12  0258   None     Chief Complaint  Patient presents with  . Chest Pain    (Consider location/radiation/quality/duration/timing/severity/associated sxs/prior treatment) HPI Comments: Ms. Hoelzel presents ambulatory for evaluation of chest pain.  She reports experiencing been having mild, intermittent discomfort for several weeks but over the last 2 hours she reports severe pain.  It radiates into her neck and jaw bilaterally as well as through to her upper back.  She has mild shortness of breath also.  She has never had any previous similar episodes.  (history limited because during H&P she became acutely altered)  Patient is a 61 y.o. female presenting with chest pain. The history is provided by the patient. No language interpreter was used.  Chest Pain The chest pain began 1 - 2 weeks ago (worse/persistent x 2 hours). Chest pain occurs intermittently. The chest pain is worsening. At its most intense, the pain is at 10/10. The pain is currently at 10/10. The severity of the pain is severe. The quality of the pain is described as heavy and sharp. The pain radiates to the left jaw, right jaw, left neck, upper back and right neck. Primary symptoms include shortness of breath. Pertinent negatives for primary symptoms include no fever, no syncope, no cough, no wheezing, no palpitations, no abdominal pain, no nausea, no vomiting, no dizziness and no altered mental status.  Associated symptoms include weakness.  Pertinent negatives for associated symptoms include no claudication, no diaphoresis, no lower extremity edema, no near-syncope, no numbness and no orthopnea. She tried nothing for the symptoms.  Her past medical history is significant for COPD and hypertension.     Past Medical History  Diagnosis Date  . Hypertension   . GERD (gastroesophageal reflux disease)     Past Surgical History  Procedure Date  . Tonsillectomy   .  Appendectomy   . Vulvectomy   . Breast surgery     No family history on file.  History  Substance Use Topics  . Smoking status: Current Every Day Smoker -- 0.5 packs/day  . Smokeless tobacco: Not on file  . Alcohol Use:     OB History    Grav Para Term Preterm Abortions TAB SAB Ect Mult Living                  Review of Systems  Constitutional: Negative for fever and diaphoresis.  HENT: Negative.   Respiratory: Positive for shortness of breath. Negative for cough and wheezing.   Cardiovascular: Positive for chest pain. Negative for palpitations, orthopnea, claudication, syncope and near-syncope.  Gastrointestinal: Negative for nausea, vomiting, abdominal pain and diarrhea.  Genitourinary: Negative.   Musculoskeletal: Positive for back pain.  Skin: Negative.   Neurological: Positive for weakness. Negative for dizziness and numbness.  Psychiatric/Behavioral: Negative.  Negative for altered mental status.    Allergies  Ciprofloxacin; Erythromycin; Penicillins; and Sulfa antibiotics  Home Medications   Current Outpatient Rx  Name Route Sig Dispense Refill  . ACETAMINOPHEN 500 MG PO TABS Oral Take 500 mg by mouth every 6 (six) hours as needed. pain    . ALBUTEROL SULFATE HFA 108 (90 BASE) MCG/ACT IN AERS Inhalation Inhale 2 puffs into the lungs every 6 (six) hours as needed. For wheezing    . ALPRAZOLAM 0.5 MG PO TABS Oral Take 0.5 tablets (0.25 mg total) by mouth daily as needed. anxiety 30 tablet 3  . MAGIC MOUTHWASH W/LIDOCAINE  Oral Take 10 mLs by mouth 3 (three) times daily as needed. 200 mL 0  . ESOMEPRAZOLE MAGNESIUM 40 MG PO CPDR Oral Take 1 capsule (40 mg total) by mouth daily. 30 capsule 5  . FLUTICASONE-SALMETEROL 250-50 MCG/DOSE IN AEPB Inhalation Inhale 1 puff into the lungs every 12 (twelve) hours.    . IBUPROFEN 100 MG PO TABS Oral Take 100 mg by mouth every 6 (six) hours as needed. pain    . LISINOPRIL-HYDROCHLOROTHIAZIDE 10-12.5 MG PO TABS Oral Take 1 tablet  by mouth daily. 90 tablet 3  . OMEPRAZOLE 20 MG PO CPDR Oral Take 1 capsule (20 mg total) by mouth 2 (two) times daily. 60 capsule 0  . RANITIDINE HCL 75 MG PO TABS Oral Take 75 mg by mouth 2 (two) times daily.      BP 97/55  Pulse 71  Temp 97.5 F (36.4 C)  Resp 22  SpO2 98%  Physical Exam  Nursing note and vitals reviewed. Constitutional: She is oriented to person, place, and time. She appears well-developed and well-nourished. She appears toxic. She appears ill. She appears distressed. Nasal cannula in place.       Pt initial alert and oriented appearing acutely anxious and ill.  Shortly after starting exam she collapsed, became pulseless, pale, with no spontaneous respirations  HENT:  Head: Normocephalic and atraumatic. Not macrocephalic and not microcephalic. Head is without raccoon's eyes, without Battle's sign, without contusion, without right periorbital erythema and without left periorbital erythema. No trismus in the jaw.  Mouth/Throat: Uvula is midline and oropharynx is clear and moist. Mucous membranes are pale, not dry and not cyanotic. No uvula swelling. No oropharyngeal exudate, posterior oropharyngeal edema, posterior oropharyngeal erythema or tonsillar abscesses.  Eyes: Conjunctivae normal and lids are normal. Pupils are equal, round, and reactive to light. Right eye exhibits no discharge and no exudate. Left eye exhibits no discharge and no exudate. Right conjunctiva is not injected. Right conjunctiva has no hemorrhage. Left conjunctiva is not injected. Left conjunctiva has no hemorrhage. No scleral icterus. Right eye exhibits no nystagmus. Left eye exhibits no nystagmus. Right pupil is round and reactive. Left pupil is round and reactive. Pupils are equal.  Neck: Normal range of motion. Neck supple. No JVD present. No tracheal deviation present.  Cardiovascular: Normal rate, regular rhythm, S1 normal, S2 normal, normal heart sounds and intact distal pulses.   Occasional  extrasystoles are present. PMI is not displaced.  Exam reveals no gallop and no friction rub.   No murmur heard.      Initial exam as noted above.  During exam pt became pulseless.    Pulmonary/Chest: Effort normal and breath sounds normal. No stridor. No respiratory distress. She has no wheezes. She has no rales. She exhibits no tenderness.  Abdominal: Soft. Bowel sounds are normal. She exhibits no distension and no mass. There is no tenderness. There is no rebound and no guarding.  Musculoskeletal: She exhibits no edema and no tenderness.  Lymphadenopathy:    She has no cervical adenopathy.  Neurological: She is alert and oriented to person, place, and time. No cranial nerve deficit.  Skin: Skin is warm and dry. No rash noted. She is not diaphoretic. No erythema. There is pallor.  Psychiatric: Her speech is normal. Her mood appears anxious. Cognition and memory are normal. Cognition and memory are not impaired. She exhibits normal recent memory and normal remote memory.    ED Course  Procedures (including critical care time)  Labs Reviewed -  No data to display No results found.   No diagnosis found.   Date: 05/24/2012  Rate: 92 bpm  Rhythm: sinus  QRS Axis: normal  Intervals: normal  ST/T Wave abnormalities: ST elevations inferiorly  Conduction Disutrbances:none  Narrative Interpretation: STEMI, inferoposterior ST elevations with septal depressions and elevations in lateral leadsV3-V6  Old EKG Reviewed: none available      MDM  Pt presents with active chest pain.  She had an EKG consistent with a STEMI.  Called for cath lab activation at 0309.  During history, she became acutely altered.  Note Vfib arrest.  Chest compressions initiated.  Pacer/defibrillator pads placed.  IV not yet established.  Ventilation assisted via ambubag.  Pt received 1 shock at 200J without effect.  An additional shock delivered and after roughly another minute of CPR, pt had a return of circulation.   She was again awake and oriented.  IV established.  Pt was administered 300mg  amiodarone, 325mg  rectal aspirin, and a 4000 unit heparin bolus.  F/u BP 107/64 but shortly thereafter noted hypotension.  2 additional IV lines established and IVF bolus infused.  Discussed her by telephone evaluation while at the bedside with Dr. Sharyn Lull (interventional cardiologist).  At his request, 180mg  brilanta administered po.  Report given to [facility] transport team.  Pt taken emerg[facility] from [facility] to [facility] for cardiac catheterization.  I discussed the EKG findings, exam, and concern for STEMI directly with the patient as well as with her daughter.  Report given to [facility] transport team.  CRITICAL CARE Performed by: Dana Allan T   Total critical care time: <30  Critical care time was exclusive of separately billable procedures and treating other patients.  Critical care was necessary to treat or prevent imminent or life-threatening deterioration.  Critical care was time spent personally by me on the following activities: development of treatment plan with patient and/or surrogate as well as nursing, discussions with consultants, evaluation of patient's response to treatment, examination of patient, obtaining history from patient or surrogate, ordering and performing treatments and interventions, ordering and review of laboratory studies, ordering and review of radiographic studies, pulse oximetry and re-evaluation of patient's condition.         Tobin Chad, MD 05/24/12 1610  Tobin Chad, MD 05/24/12 [id number]

## 2012-05-24 NOTE — ED Notes (Signed)
0307-EKG completed in triage given to Dr. Lorenso Courier 0309-Code Stemi called by Dr. Lorenso Courier, pt moved to room 9 via WC 0310-pt in Vfib, compressions initiated by T.Neilson,RN shock initiated 120J, compressions restarted, pt being ventilated by BVM 0312-no pulse, IVs being established, pt shocked again 200J, compressions restarted 0313-pt attempting to speak, +pulse, repeat EKG done, daughter at bedside.  Pt medicated per MD order including orders given by Dr. Sharyn Lull for Brilinta PO. Pt unable to rate pain, only that "it hurts" 0332-[facility] at bedside.  0337-pt out of room.

## 2012-05-24 NOTE — Progress Notes (Signed)
Right Venous sheath removed.  Manual pressure held for 15 minutes.  Patient education complete.  Bandage applied, will continue to monitor patient.  Site is level 0.

## 2012-05-24 NOTE — Progress Notes (Signed)
Subjective:  Patient complains of localized pleuritic retrosternal chest pain. Denies any anginal chest pain denies shortness of breath.  Objective:  Vital Signs in the last 24 hours: Temp:  [97.5 F (36.4 C)-98.1 F (36.7 C)] 97.9 F (36.6 C) (09/19 1200) Pulse Rate:  [55-94] 87  (09/19 1730) Resp:  [7-22] 19  (09/19 1730) BP: (83-143)/(34-86) 100/61 mmHg (09/19 1730) SpO2:  [97 %-100 %] 98 % (09/19 1730) Weight:  [83.6 kg (184 lb 4.9 oz)] 83.6 kg (184 lb 4.9 oz) (09/19 0700)  Intake/Output from previous day:   Intake/Output from this shift: Total I/O In: 4123.6 [I.V.:2123.6; Other:2000] Out: 2150 [Urine:2150]  Physical Exam: Neck: no adenopathy, no carotid bruit, no JVD and supple, symmetrical, trachea midline Lungs: clear to auscultation bilaterally Heart: regular rate and rhythm, S1, S2 normal and Soft systolic murmur noted no pericardial rub or S3 gallop Abdomen: soft, non-tender; bowel sounds normal; no masses,  no organomegaly Extremities: extremities normal, atraumatic, no cyanosis or edema and Both groin dressing dry no hematoma  Lab Results:  Basename 05/24/12 1453 05/24/12 0750  WBC 13.0* 18.6*  HGB 11.3* 13.4  PLT 230 273    Basename 05/24/12 0750  NA 138  K 3.6  CL 109  CO2 18*  GLUCOSE 133*  BUN 8  CREATININE 0.51    Basename 05/24/12 1332 05/24/12 0750  TROPONINI >20.00* >20.00*   Hepatic Function Panel  Basename 05/24/12 0750  PROT 5.6*  ALBUMIN 3.0*  AST 115*  ALT 73*  ALKPHOS 123*  BILITOT 0.3  BILIDIR --  IBILI --   No results found for this basename: CHOL in the last 72 hours No results found for this basename: PROTIME in the last 72 hours  Imaging: Imaging results have been reviewed and No results found.  Cardiac Studies:  Assessment/Plan:  Status post acute inferoposterolateral infarction status post PTCA aspiration thrombectomy and multiple stents to distal meds and RCA with excellent result Status post V. fib  arrest Hypertension COPD Hypercholesteremia Tobacco abuse Positive family history of coronary artery disease Acute anemia secondary to blood loss during the procedure and hydration Plan Continue present management check labs in a.m.  LOS: 0 days    Osby Sweetin N 05/24/2012, 5:57 PM

## 2012-05-24 NOTE — Cardiovascular Report (Signed)
[name], [name]                ACCOUNT NO.:  [id number]  MEDICAL RECORD NO.:  [id number]  LOCATION:  2907                         FACILITY:  [facility]  PHYSICIAN:  Eduardo Osier. Sharyn Lull, M.D. DATE OF BIRTH:  1950/10/12  DATE OF PROCEDURE:  05/24/2012 DATE OF DISCHARGE:                           CARDIAC CATHETERIZATION   PROCEDURE: 1. Left cardiac catheterization with selective left and right coronary     angiography via left groin using Judkins technique. 2. Insertion of temporary transvenous pacemaker via right femoral     venous approach. 3. Percutaneous transluminal coronary angioplasty to proximal, mid and     distal right coronary artery using 2.5 x 12 and 2.5 x 20 mm long,     Emerge monorail balloon. 4. Aspiration thrombectomy of the right coronary artery using Xpress-     Way catheter. 5. Aspiration atherectomy using Possis AngioJet catheter. 6. Successful deployment of 2.5 x 38 mm long Xience drug-eluting stent     in distal right coronary artery. 7. Successful deployment of 2.75 x 38 mm long Xience Xpedition drug-     eluting stent in mid right coronary artery. 8. Successful deployment of 3.0 x 38 mm long Xience Xpedition drug-     eluting stent in proximal right coronary artery. 9. Successful postdilatation of the stents using 3.0 x 20 mm long [place]     Trek balloon.  INDICATION FOR THE PROCEDURE:  [name] is a 61 year old female with past medical history significant for hypertension, hypercholesteremia, hiatal hernia, tobacco abuse, COPD who was transferred from [facility] ER as code STEMI was called.  The patient complained of retrosternal chest pain described as pressure radiating to jaw associated with nausea, diaphoresis, and mild shortness of breath, grade 8 to 9/10, which woke her up.  Denies any palpitation, lightheadedness or syncope.  The patient states she has been having this chest pain for the last 1 month off and on but did not seek any medical  attention.  EKG done in the ER showed normal sinus rhythm with marked ST elevation in inferior leads and ST depression in lead V1 and V2 suggestive of inferoposterior wall injury with reciprocal changes in lead 1 and aVL, and also minimal ST elevation in lateral leads.  The patient was transferred emergently from [facility] ER to [facility] for emergency PCI.  DESCRIPTION OF PROCEDURE:  After obtaining the informed consent, the patient was placed on fluoroscopy table.  Right and left groin were prepped and draped in usual fashion.  A 1% Xylocaine was used for local anesthesia in the right and left groin.  I attempted to insert a right femoral arterial sheath without success, and then a 6-French arterial sheath was placed in left femoral artery without difficulty.  A 6-French left Judkins catheter was advanced over the wire under fluoroscopic guidance up to the ascending aorta.  Wire was pulled out. The catheter was aspirated and connected to the Manifold.  Catheter was further advanced and engaged into left coronary ostium.  Multiple views of the left system were taken.  Next, the catheter was disengaged and was pulled out over the wire and was replaced with 6-French right guiding catheter,  which was advanced over the wire under fluoroscopic guidance up to the ascending aorta. Wire was pulled out.  The catheter was aspirated and connected to the Manifold.  Catheter was further advanced and engaged into right coronary ostium.  A single view of right coronary artery was obtained.  Next, the catheter was disengaged at the end of the procedure and was replaced with 6-French pigtail catheter, which was advanced over the wire under fluoroscopic guidance up to the ascending aorta.  Wire was pulled out.  The catheter was aspirated and connected to the Manifold. Catheter was further advanced across the aortic valve into the LV.  LV pressures were recorded.  LV graphy was done in 30-degree  RAO position.  Post angiographic pressures were recorded from LV and then pullback pressures were recorded from aorta.  There was no significant gradient across the aortic valve.  Next, pigtail catheter was pulled out over the wire.  Sheaths were aspirated and flushed.  Also, temporary transvenous pacemaker was inserted via right femoral venous approach which was discontinued at the end of the procedure.  FINDINGS:  LV showed good LV systolic function.  There was mild-to- moderate inferior wall hypokinesia, EF of approximately 50%.  Left main has 40-50% ostial and 20-25% distal stenosis.  LAD has 20-30% proximal and mid stenosis.  Diagonal 1 and 2 were small which were patent.  Ramus was small which was patent.  Left circumflex has 30-40% proximal stenosis with the bifurcation with OM 1.  OM 1 is patent.  RCA was 100% occluded beyond proximal portion filling faintly distally from collaterals from left system.  RCA had large thrombus burden.  INTERVENTIONAL PROCEDURE: 1. PTCA to proximal RCA was done using 2.5 x 12 mm long, Emerge     balloon for predilatation.  Angiogram showed no flow with large     burden of thrombus. 2. Aspiration thrombectomy was done using Xpress-Way catheter, one     pass was done without any improvement in blood flow. 3. PTCA to mid and proximal RCA was done using 2.5 x 20 mm long Emerge     monorail balloon.  Multiple inflations were done without any     improvement in blood flow. 4. PTCA to distal RCA was done using 2.0 x 20 mm long, Emerge monorail     balloon.  Multiple inflations were done, and using 2.5 x 20 mm     long, Emerge monorail balloon in mid and proximal portion without     any improvement in the blood flow. 5. Aspiration thrombectomy was done using Possis AngioJet going all     the way up to the distal portion of the vessel.  Angiogram showed     TIMI 0 distal flow without any visualization of the vessel     distally. 6. A 2.5 x 38 mm long,  Xience, Xpedition drug-eluting stent was     deployed in distal RCA at 11 atmospheric pressure, and then 2.75 x     38 mm long, Xience, Xpedition drug-eluting stent was deployed in     mid RCA overlapping with the distal stent, and then 3.0 x 38 mm     long, Xience, Xpedition drug-eluting stent was deployed at 11     atmospheric pressure and proximal RCA at 11 atmospheric pressure.     The stents were post dilated using 3.0 x 20 mm long [place] Trek balloon     going from 10 atmospheric pressure in distal, 15 atmospheric  pressure in mid, and 18 atmospheric pressure in proximal RCA.     Lesions dilated from 100% to 0% residual with excellent final TIMI     grade 3 distal flow without evidence of dissection or distal     embolization.  The patient did not had 1 episode of VFib during the     procedure requiring defibrillation during insertion of temporary     transvenous pacemaker. Temporary pacemaker was pulled back up to     the right atrium and was     discontinued at the end of the procedure.  The patient received     weight based Angiomax, Integrilin, 180 mg of Brilinta and aspirin     during the procedure.  The patient tolerated the procedure well.     There were no complications.  The patient was transferred to     recovery room in stable condition.     Eduardo Osier. Sharyn Lull, M.D.     MNH/MEDQ  D:  05/24/2012  T:  05/24/2012  Job:  [id number]

## 2012-05-24 NOTE — Care Management Note (Signed)
    Page 1 of 1   05/24/2012     12:19:07 PM   CARE MANAGEMENT NOTE 05/24/2012  Patient:  [name], [name]   Account Number:  [id number]  Date Initiated:  05/24/2012  Documentation initiated by:  Junius Creamer  Subjective/Objective Assessment:   adm w mi     Action/Plan:   lives alone, pcp dr Benny Lennert   Anticipated DC Date:     Anticipated DC Plan:        DC Planning Services  CM consult      Choice offered to / List presented to:             Status of service:   Medicare Important Message given?   (If response is "NO", the following Medicare IM given date fields will be blank) Date Medicare IM given:   Date Additional Medicare IM given:    Discharge Disposition:    Per UR Regulation:  Reviewed for med. necessity/level of care/duration of stay  If discussed at Long Length of Stay Meetings, dates discussed:    Comments:  9/19 9:32a debbie Jynesis Nakamura rn,bsn [phone] gave pt 30day free brilinta card. left pt assist form on chart for md to sign for brilinta. she has no ins. gave pt 2 prescription assist cards that may help w nongeneric meds. gave pt web site and several pt assist forms for meds she was on pta.

## 2012-05-24 NOTE — H&P (Signed)
[name] is an 61 y.o. female.   Chief Complaint: Chest pain HPI: Patient is 61 year old female with past medical history significant for hypertension, hypercholesteremia, hiatus hernia, tobacco use, COPD, was transferred from [facility] ER as code STEMI was called. Patient complained of retrosternal chest pain described as pressure radiating to jaw associated with nausea diaphoresis and mild shortness of breath grade -8-9/10 which woke her up. Denies any palpitation lightheadedness or syncope EKG done in the ER showed normal sinus rhythm with marked ST elevation in inferior leads and ST depression in lead V1 to V2 suggestive of inferoposterior wall acute injury patient went into V. fib arrest in the ER at [facility] requiring defibrillation x2 received IV 300 mg bolus of amiodarone and was transferred to [facility] for emergency PCI. Patient states she has been having recurrent chest pain off and on for last 1 month but did not seek any medical attention. Continues to smoke a half to 3/4 pack every day for last 45+ years.   Past Medical History  Diagnosis Date  . Hypertension   . GERD (gastroesophageal reflux disease)     Past Surgical History  Procedure Date  . Tonsillectomy   . Appendectomy   . Vulvectomy   . Breast surgery     No family history on file. Social History:  reports that she has been smoking.  She does not have any smokeless tobacco history on file. Her alcohol and drug histories not on file.  Allergies:  Allergies  Allergen Reactions  . Ciprofloxacin Anaphylaxis  . Erythromycin Nausea And Vomiting  . Penicillins Rash  . Sulfa Antibiotics Rash    Medications Prior to Admission  Medication Sig Dispense Refill  . acetaminophen (TYLENOL) 500 MG tablet Take 500 mg by mouth every 6 (six) hours as needed. pain      . albuterol (PROVENTIL HFA;VENTOLIN HFA) 108 (90 BASE) MCG/ACT inhaler Inhale 2 puffs into the lungs every 6 (six)  hours as needed. For wheezing      . ALPRAZolam (XANAX) 0.5 MG tablet Take 0.5 tablets (0.25 mg total) by mouth daily as needed. anxiety  30 tablet  3  . Alum & Mag Hydroxide-Simeth (MAGIC MOUTHWASH W/LIDOCAINE) SOLN Take 10 mLs by mouth 3 (three) times daily as needed.  200 mL  0  . esomeprazole (NEXIUM) 40 MG capsule Take 1 capsule (40 mg total) by mouth daily.  30 capsule  5  . Fluticasone-Salmeterol (ADVAIR) 250-50 MCG/DOSE AEPB Inhale 1 puff into the lungs every 12 (twelve) hours.      Marland Kitchen ibuprofen (ADVIL,MOTRIN) 100 MG tablet Take 100 mg by mouth every 6 (six) hours as needed. pain      . lisinopril-hydrochlorothiazide (PRINZIDE,ZESTORETIC) 10-12.5 MG per tablet Take 1 tablet by mouth daily.  90 tablet  3  . omeprazole (PRILOSEC) 20 MG capsule Take 1 capsule (20 mg total) by mouth 2 (two) times daily.  60 capsule  0  . ranitidine (ZANTAC) 75 MG tablet Take 75 mg by mouth 2 (two) times daily.        No results found for this or any previous visit (from the past 48 hour(s)). No results found.  Review of Systems  Constitutional: Negative for fever and chills.  Eyes: Negative for blurred vision.  Respiratory: Positive for shortness of breath. Negative for cough, hemoptysis and sputum production.   Cardiovascular: Positive for chest pain. Negative for palpitations, orthopnea, claudication and leg swelling.  Gastrointestinal: Positive for nausea. Negative  for abdominal pain.  Genitourinary: Negative for dysuria.  Neurological: Negative for dizziness and headaches.    Blood pressure 93/50, pulse 59, temperature 97.5 F (36.4 C), resp. rate 22, SpO2 98.00%. Physical Exam  Constitutional: She is oriented to person, place, and time.  HENT:  Head: Normocephalic and atraumatic.  Eyes: Conjunctivae normal are normal. Pupils are equal, round, and reactive to light. Left eye exhibits no discharge. No scleral icterus.  Neck: Normal range of motion. Neck supple. No JVD present. No tracheal  deviation present. No thyromegaly present.  Cardiovascular:       Sinus bradycardia on the monitor S1 and S2 soft soft systolic murmur and S4 gallop  Respiratory:       Clear to auscultation anterolaterally  GI: Soft. Bowel sounds are normal. She exhibits no distension. There is no tenderness. There is no rebound.  Musculoskeletal: She exhibits no edema and no tenderness.  Neurological: She is alert and oriented to person, place, and time.     Assessment/Plan Acute inferoposterior wall myocardial infarction Status post V. fib arrest Hypertension COPD Hypercholesteremia Tobacco abuse Positive family history of coronary artery disease Plan Discussed with patient briefly regarding emergency PTCA stenting its risk and benefits and consents for PCI.   Sadik Piascik N 05/24/2012, 6:32 AM

## 2012-05-24 NOTE — Progress Notes (Signed)
Received pt from cath lab at 0645; hooked pt up to monitor and zoll; wiped down with CHG; MRSA swabbed; educated pt on keeping legs straight and head lower than 30 degrees; report given to day RN; family at bedside; emotional support given to pt & to family.

## 2012-05-24 NOTE — ED Notes (Signed)
Per daughter pt has been having chest pain on and off; tonight for past two hours severe chest pain--"discomfort" radiating into jaw; feeling short of breath

## 2012-05-24 NOTE — CV Procedure (Signed)
Left cardiac cath/PTCA stenting report dictated on 919 13 dictation number is [phone]

## 2012-05-24 NOTE — ED Notes (Signed)
PT transferred to cath lab with Dr. Sharyn Lull; report given to cath lab RNs. Pt A&Ox3; pale; chest pain 8/10.

## 2012-05-24 NOTE — Progress Notes (Signed)
Arterial sheath removed from let femoral area. Held pressure for 20 mins. No hematoma dressing dry. Papable dp pulse.Patient instructed in purcautions. [name] rcis

## 2012-05-25 ENCOUNTER — Inpatient Hospital Stay (HOSPITAL_COMMUNITY): Payer: MEDICAID

## 2012-05-25 LAB — CBC
Hemoglobin: 10.7 g/dL — ABNORMAL LOW (ref 12.0–15.0)
MCHC: 32.1 g/dL (ref 30.0–36.0)
WBC: 12 10*3/uL — ABNORMAL HIGH (ref 4.0–10.5)

## 2012-05-25 LAB — BASIC METABOLIC PANEL
Chloride: 113 mEq/L — ABNORMAL HIGH (ref 96–112)
Glucose, Bld: 96 mg/dL (ref 70–99)
Potassium: 4.1 mEq/L (ref 3.5–5.1)
Sodium: 144 mEq/L (ref 135–145)

## 2012-05-25 LAB — CK TOTAL AND CKMB (NOT AT ARMC)
CK, MB: 48.4 ng/mL (ref 0.3–4.0)
Relative Index: 6.3 — ABNORMAL HIGH (ref 0.0–2.5)

## 2012-05-25 LAB — MAGNESIUM: Magnesium: 1.9 mg/dL (ref 1.5–2.5)

## 2012-05-25 LAB — LIPID PANEL
Cholesterol: 210 mg/dL — ABNORMAL HIGH (ref 0–200)
HDL: 31 mg/dL — ABNORMAL LOW (ref >39)
Total CHOL/HDL Ratio: 6.8 RATIO
Triglycerides: 179 mg/dL — ABNORMAL HIGH (ref <150)

## 2012-05-25 MED ORDER — PNEUMOCOCCAL VAC POLYVALENT 25 MCG/0.5ML IJ INJ
0.5000 mL | INJECTION | INTRAMUSCULAR | Status: AC
  Administered 2012-05-26: 0.5 mL via INTRAMUSCULAR
  Filled 2012-05-25: qty 0.5

## 2012-05-25 MED ORDER — OXYCODONE-ACETAMINOPHEN 5-325 MG PO TABS
1.0000 | ORAL_TABLET | Freq: Four times a day (QID) | ORAL | Status: DC | PRN
  Administered 2012-05-25 – 2012-05-26 (×3): 1 via ORAL
  Filled 2012-05-25 (×4): qty 1

## 2012-05-25 MED ORDER — INFLUENZA VIRUS VACC SPLIT PF IM SUSP
0.5000 mL | INTRAMUSCULAR | Status: AC
  Administered 2012-05-26: 0.5 mL via INTRAMUSCULAR
  Filled 2012-05-25: qty 0.5

## 2012-05-25 MED FILL — Dextrose Inj 5%: INTRAVENOUS | Qty: 1000 | Status: AC

## 2012-05-25 NOTE — Progress Notes (Signed)
Subjective:  Patient complains of soreness in the chest with movement. No anginal chest pain or shortness of breath  Objective:  Vital Signs in the last 24 hours: Temp:  [97.9 F (36.6 C)-98.4 F (36.9 C)] 98.4 F (36.9 C) (09/20 0820) Pulse Rate:  [70-92] 86  (09/20 0820) Resp:  [7-27] 20  (09/20 0820) BP: (78-115)/(40-74) 110/58 mmHg (09/20 0820) SpO2:  [95 %-100 %] 97 % (09/20 0820)  Intake/Output from previous day: 09/19 0701 - 09/20 0700 In: 5550.8 [P.O.:120; I.V.:3430.8] Out: 4600 [Urine:4600] Intake/Output from this shift:    Physical Exam: Neck: no adenopathy, no carotid bruit, no JVD and supple, symmetrical, trachea midline Lungs: clear to auscultation bilaterally Heart: regular rate and rhythm, S1, S2 normal and Soft systolic murmur noted no pericardial rub Abdomen: soft, non-tender; bowel sounds normal; no masses,  no organomegaly Extremities: extremities normal, atraumatic, no cyanosis or edema  Lab Results:  Basename 05/25/12 0505 05/24/12 1453  WBC 12.0* 13.0*  HGB 10.7* 11.3*  PLT 220 230    Basename 05/25/12 0505 05/24/12 0750  NA 144 138  K 4.1 3.6  CL 113* 109  CO2 22 18*  GLUCOSE 96 133*  BUN 6 8  CREATININE 0.63 0.51    Basename 05/25/12 0505 05/24/12 2004  TROPONINI 16.04* >20.00*   Hepatic Function Panel  Basename 05/24/12 0750  PROT 5.6*  ALBUMIN 3.0*  AST 115*  ALT 73*  ALKPHOS 123*  BILITOT 0.3  BILIDIR --  IBILI --    Basename 05/25/12 0505  CHOL 210*   No results found for this basename: PROTIME in the last 72 hours  Imaging: Imaging results have been reviewed and No results found.  Cardiac Studies:  Assessment/Plan:  Status post acute inferoposterolateral infarction status post PTCA aspiration thrombectomy and multiple stents to distal meds and RCA with excellent result  Status post V. fib arrest  Hypertension  COPD  Hypercholesteremia  Tobacco abuse  Positive family history of coronary artery disease  Acute  anemia secondary to blood loss during the procedure and hydration  Plan Continue present management Check chest x-ray Phase I cardiac rehabilitation  LOS: 1 day    [name] N 05/25/2012, 8:58 AM

## 2012-05-25 NOTE — Progress Notes (Signed)
CARDIAC REHAB PHASE I   PRE:  Rate/Rhythm: 87 Sr getting to EOB    BP: sitting 102/60    SaO2: 97 RA  MODE:  Ambulation: 240 ft   POST:  Rate/Rhythm: 87 SR    BP: sitting 102/53     SaO2: 97 RA  Pt c/o intense pain around diaphragm and also left shoulder/neck. Gets SOB moving in room due to pain. Able to walk holding to RW. Used gait belt to assist. Some SOB. To recliner. BP stable. No dizziness. Got morphine before walk. Began ed. Will need reinforcement and ex/NTG tomorrow. Prob can't do CRPII due to travel with job.  [phone]  Harriet Masson CES, ACSM

## 2012-05-26 LAB — BASIC METABOLIC PANEL
BUN: 10 mg/dL (ref 6–23)
GFR calc Af Amer: >90 mL/min (ref >90)
GFR calc non Af Amer: >90 mL/min (ref >90)
Potassium: 3.7 mEq/L (ref 3.5–5.1)

## 2012-05-26 LAB — CBC
HCT: 30.3 % — ABNORMAL LOW (ref 36.0–46.0)
MCHC: 33 g/dL (ref 30.0–36.0)
Platelets: 205 10*3/uL (ref 150–400)
RDW: 14.6 % (ref 11.5–15.5)
WBC: 12.5 10*3/uL — ABNORMAL HIGH (ref 4.0–10.5)

## 2012-05-26 LAB — CK TOTAL AND CKMB (NOT AT ARMC): CK, MB: 7.6 ng/mL (ref 0.3–4.0)

## 2012-05-26 LAB — TROPONIN I: Troponin I: 6.63 ng/mL (ref <0.30)

## 2012-05-26 MED ORDER — ALBUTEROL SULFATE HFA 108 (90 BASE) MCG/ACT IN AERS
2.0000 | INHALATION_SPRAY | Freq: Four times a day (QID) | RESPIRATORY_TRACT | Status: DC | PRN
  Administered 2012-05-26 – 2012-05-27 (×2): 2 via RESPIRATORY_TRACT
  Filled 2012-05-26: qty 6.7

## 2012-05-26 NOTE — Progress Notes (Signed)
Subjective:  Patient complains of musculoskeletal pain in shoulders hips and chest. No anginal chest pain. Denies any shortness of breath. Denies any palpitations. Had few beats of nonsustained VT on the monitor asymptomatic cardiac enzymes trending down  Objective:  Vital Signs in the last 24 hours: Temp:  [97.9 F (36.6 C)-98.5 F (36.9 C)] 98.5 F (36.9 C) (09/21 0731) Resp:  [16-20] 18  (09/21 0731) BP: (75-113)/(39-64) 113/64 mmHg (09/21 0730) SpO2:  [92 %-96 %] 95 % (09/21 0731)  Intake/Output from previous day: 09/20 0701 - 09/21 0700 In: 720 [P.O.:720] Out: 275 [Urine:275] Intake/Output from this shift:    Physical Exam: Neck: no adenopathy, no carotid bruit, no JVD and supple, symmetrical, trachea midline Lungs: Decreased breath sound at bases no rhonchi or rales Heart: regular rate and rhythm, S1, S2 normal and Soft systolic murmur noted no pericardial rub no S3 gallop Abdomen: soft, non-tender; bowel sounds normal; no masses,  no organomegaly Extremities: extremities normal, atraumatic, no cyanosis or edema and Both groin stable  Lab Results:  Basename 05/26/12 0445 05/25/12 0505  WBC 12.5* 12.0*  HGB 10.0* 10.7*  PLT 205 220    Basename 05/26/12 0445 05/25/12 0505  NA 142 144  K 3.7 4.1  CL 109 113*  CO2 22 22  GLUCOSE 89 96  BUN 10 6  CREATININE 0.59 0.63    Basename 05/26/12 0445 05/25/12 0505  TROPONINI 6.63* 16.04*   Hepatic Function Panel  Basename 05/24/12 0750  PROT 5.6*  ALBUMIN 3.0*  AST 115*  ALT 73*  ALKPHOS 123*  BILITOT 0.3  BILIDIR --  IBILI --    Basename 05/25/12 0505  CHOL 210*   No results found for this basename: PROTIME in the last 72 hours  Imaging: Imaging results have been reviewed and Dg Chest 2 View  05/25/2012  *RADIOLOGY REPORT*  Clinical Data: Chest pain  CHEST - 2 VIEW  Comparison: Chest radiograph August 08, 2006 and chest CT June 16, 2008  Findings: There is a small left effusion with mild left base  atelectasis.  The lungs are otherwise clear.  Heart size and pulmonary vascularity are normal.  No adenopathy.  No pneumothorax. No bone lesions identified. There is thoracolumbar dextroscoliosis.  IMPRESSION: Small left effusion with left base atelectasis.  Lungs otherwise clear.   Original Report Authenticated By: Arvin Collard. WOODRUFF III, M.D.     Cardiac Studies:  Assessment/Plan:  Status post acute inferoposterolateral infarction status post PTCA aspiration thrombectomy and multiple stents to distal meds and RCA with excellent result  Status post V. fib arrest  Status post nonsustained VT Hypertension  COPD  Hypercholesteremia  Tobacco abuse  Positive family history of coronary artery disease  Acute anemia secondary to blood loss during the procedure and hydration  Musculoskeletal pain Plan As per orders Transfer to telemetry Possible discharge in a.m.  LOS: 2 days    [name] N 05/26/2012, 11:51 AM

## 2012-05-26 NOTE — Progress Notes (Signed)
CARDIAC REHAB PHASE I   PRE:  Rate/Rhythm: 79  BP:  Supine:   Sitting: 94/51  Standing:    SaO2: 97 ra  MODE:  Ambulation: 350 ft   POST:  Rate/Rhythem: 88  BP:  Supine:   Sitting: 103/63  Standing:    SaO2: 98 ra    Patient complained of pain in her left shoulder area and rib cage. Patient ambulated with RW  x1 assist. Patient tolerated well took several rest breaks due to hip pain. Patient VSS. Completed education. Pt will call outpatient cardiac rehab to let us know if she can participate in the rehab program.  Brock Ra, MS, NASM, CES  And  [place]. [place]. RN BROWN, Leighton Brickley L

## 2012-05-27 LAB — CBC
HCT: 30.9 % — ABNORMAL LOW (ref 36.0–46.0)
Hemoglobin: 10.4 g/dL — ABNORMAL LOW (ref 12.0–15.0)
MCH: 31 pg (ref 26.0–34.0)
MCHC: 33.7 g/dL (ref 30.0–36.0)
RDW: 14.5 % (ref 11.5–15.5)

## 2012-05-27 LAB — BASIC METABOLIC PANEL
BUN: 12 mg/dL (ref 6–23)
Calcium: 9.1 mg/dL (ref 8.4–10.5)
GFR calc non Af Amer: >90 mL/min (ref >90)
Glucose, Bld: 113 mg/dL — ABNORMAL HIGH (ref 70–99)
Sodium: 139 mEq/L (ref 135–145)

## 2012-05-27 MED ORDER — NITROGLYCERIN 0.4 MG SL SUBL: 0.4000 mg | SUBLINGUAL_TABLET | SUBLINGUAL | Status: AC | PRN

## 2012-05-27 MED ORDER — PRAVASTATIN SODIUM 40 MG PO TABS: 40.0000 mg | ORAL_TABLET | Freq: Every day | ORAL | Status: DC

## 2012-05-27 MED ORDER — LISINOPRIL 10 MG PO TABS: 10.0000 mg | ORAL_TABLET | Freq: Every day | ORAL | Status: DC

## 2012-05-27 MED ORDER — ASPIRIN 81 MG PO TBEC: 81.0000 mg | DELAYED_RELEASE_TABLET | Freq: Every day | ORAL | Status: DC

## 2012-05-27 MED ORDER — TICAGRELOR 90 MG PO TABS: 90.0000 mg | ORAL_TABLET | Freq: Two times a day (BID) | ORAL | Status: DC

## 2012-05-27 MED ORDER — METOPROLOL TARTRATE 12.5 MG HALF TABLET: 12.5000 mg | ORAL_TABLET | Freq: Two times a day (BID) | ORAL | Status: DC

## 2012-05-27 NOTE — Discharge Summary (Signed)
  Discharge summary dictated on 05/27/2012 dictation number is [phone]

## 2012-05-28 NOTE — Discharge Summary (Signed)
NAMEREYANN, Kristina                ACCOUNT NO.:  [id number]  MEDICAL RECORD NO.:  [id number]  LOCATION:                                 FACILITY:  PHYSICIAN:  Prince Couey N. Sharyn Lull, M.D.      DATE OF BIRTH:  DATE OF ADMISSION:  05/24/2012 DATE OF DISCHARGE:  05/27/2012                              DISCHARGE SUMMARY   ADMITTING DIAGNOSES: 1. Acute inferoposterior wall myocardial infarction. 2. Status post ventricular fibrillation cardiac arrest. 3. Hypertension. 4. Chronic obstructive pulmonary disease. 5. Hypercholesteremia. 6. Tobacco abuse. 7. Positive family history of coronary artery disease.  DISCHARGE DIAGNOSES: 1. Status post acute inferoposterior wall myocardial infarction status     post emergency percutaneous coronary intervention to 100% occluded     right coronary artery as per procedure report. 2. Status post ventricular fibrillation arrest. 3. Hypertension. 4. Hypercholesteremia. 5. Chronic obstructive pulmonary disease. 6. History of tobacco abuse. 7. Positive family history of coronary artery disease.  DISCHARGE HOME MEDICATIONS:  Enteric-coated aspirin 81 mg 1 tablet daily, lisinopril 10 mg 1 tablet daily, Lopressor 12.5 mg twice daily, Nitrostat 0.4 mg sublingual use as directed, Pravachol 40 mg 1 tablet daily, Brilinta 90 mg 1 tablet twice daily.  The patient has been advised to continue with Tylenol 500 mg every 6 hours as needed, albuterol inhaler 2 puffs every 6 hours as needed, Xanax 0.5 mg by mouth as needed for anxiety, Advair 1 puff every 12 hours as before, Advair 250/50 mcg per dose twice daily as before, omeprazole 20 mg 1 capsule twice daily as before, ranitidine 75 mg 2 times daily as before as needed.  The patient has been advised to stop ibuprofen and lisinopril, hydrochlorothiazide.  DIET:  Low salt, low cholesterol.  ACTIVITY:  Increase activity slowly as tolerated.  Post-PTCA stent instructions have been given.  The patient will be  scheduled for phase 2 cardiac rehab as outpatient.  Follow up with me in 1 week.  CONDITION AT DISCHARGE:  Stable.  BRIEF HISTORY AND HOSPITAL COURSE:  [name] is a 61 year old white female with past medical history significant for hypertension, hypercholesteremia, hiatus hernia, tobacco abuse, COPD.  She was transferred from [facility] ER as code STEMI was called.  The patient complained of retrosternal chest pain, described as pressure radiating to jaw associated with nausea or diaphoresis, mild shortness of breath, grade 8 to 9/10, which woke her up from sleep.  Denies any palpitation, lightheadedness, or syncope.  EKG done in the ER showed normal sinus rhythm with marked ST elevation in inferior leads and ST depression in V1 and V2 and reciprocal changes in lead I and aVL suggestive of inferoposterior wall acute injury.  The patient went into VFib arrest in the ER at [facility] requiring defibrillation x2 and received IV 300 mg bolus of amiodarone and was transferred to [facility] for emergency PCI.  The patient states she has been having recurrent chest pain off and on for 1 month, but did not seek any medical attention.  Continues to smoke 3/4 pack per day for last 45+ years.  PAST MEDICAL HISTORY:  As above.  PAST SURGICAL HISTORY:  She  had tonsillectomy in the past.  Had appendectomy in the past.  Had valvulectomy in the past.  Had breast augmentation surgery in the past.  PHYSICAL EXAMINATION:  GENERAL:  She was alert, awake, complaining of severe chest pain.  Blood pressure was 93/50, pulse was 59.  She was afebrile. HEENT:  Conjunctivae were pink. NECK:  Supple.  No JVD.  No bruit. LUNGS:  Clear to auscultation anterolaterally. CARDIOVASCULAR:  S1, S2 was soft.  There was soft systolic murmur and S4 gallop. ABDOMEN:  Soft.  Bowel sounds were present.  Nontender. EXTREMITIES:  No clubbing, cyanosis, or edema.  LABORATORY DATA:  Sodium  was 141, potassium 2.7, repeat potassium was 3.6, BUN 9, creatinine 0.60.  Troponin I first set was above 20; next 2 sets were about 20; fourth set was 16.04.  CK was 765, MB 48.4.  Repeat troponin I was 6.63, CK was 236, MB 7.6.  Today, CK is 131, MB 3.8, troponin I is trending down to 2.24.  Today, hemoglobin is 10.4, hematocrit 30.9, which has been stable for the last 2 days.  White count is 11.6, sodium is 139, potassium 3.5, BUN 12, creatinine 0.59, glucose fasting yesterday was 89.  EKG on 20th showed normal sinus rhythm with evolving inferior wall myocardial infarction.  BRIEF HOSPITAL COURSE:  The patient was transferred from [facility] ER to directly [facility] ER and subsequently to cath lab and underwent emergency PTCA stenting as per procedure report.  The patient tolerated procedure well.  There were no complications.  Postprocedure, the patient did not have any anginal chest pain.  Her groin is stable with no evidence of hematoma or bruit.  Phase 1 cardiac rehab was called. The patient has been ambulating in hallway without any problems.  The patient will be discharged home on above medications and will be followed in my office in 1 week.  The patient will be scheduled for phase 2 cardiac rehab as outpatient.  The patient has been counseled regarding lifestyle modification and smoking cessation to which she agrees.     Eduardo Osier. Sharyn Lull, M.D.     MNH/MEDQ  D:  05/27/2012  T:  05/28/2012  Job:  [id number]

## 2012-05-29 ENCOUNTER — Telehealth: Payer: Self-pay

## 2012-05-29 NOTE — Telephone Encounter (Signed)
Pt needs a medical release of papers faxed to her home.  Released from surgery and will need assistance.   Fax [phone]

## 2012-05-30 NOTE — Telephone Encounter (Signed)
Faxed over the requested release of information to patient per request.

## 2012-08-29 ENCOUNTER — Emergency Department (HOSPITAL_COMMUNITY): Payer: Self-pay

## 2012-08-29 ENCOUNTER — Observation Stay (HOSPITAL_COMMUNITY)
Admission: EM | Admit: 2012-08-29 | Discharge: 2012-08-30 | Disposition: A | Discharge status: Home / Self Care | Payer: Self-pay | Attending: Internal Medicine | Admitting: Internal Medicine

## 2012-08-29 ENCOUNTER — Encounter (HOSPITAL_COMMUNITY): Payer: Self-pay | Admitting: Emergency Medicine

## 2012-08-29 DIAGNOSIS — K219 Gastro-esophageal reflux disease without esophagitis: Secondary | ICD-10-CM | POA: Insufficient documentation

## 2012-08-29 DIAGNOSIS — I2510 Atherosclerotic heart disease of native coronary artery without angina pectoris: Secondary | ICD-10-CM | POA: Insufficient documentation

## 2012-08-29 DIAGNOSIS — I10 Essential (primary) hypertension: Secondary | ICD-10-CM | POA: Insufficient documentation

## 2012-08-29 DIAGNOSIS — R079 Chest pain, unspecified: Principal | ICD-10-CM | POA: Insufficient documentation

## 2012-08-29 DIAGNOSIS — I252 Old myocardial infarction: Secondary | ICD-10-CM | POA: Insufficient documentation

## 2012-08-29 DIAGNOSIS — R0602 Shortness of breath: Secondary | ICD-10-CM | POA: Insufficient documentation

## 2012-08-29 DIAGNOSIS — R0789 Other chest pain: Secondary | ICD-10-CM | POA: Diagnosis present

## 2012-08-29 DIAGNOSIS — Z9861 Coronary angioplasty status: Secondary | ICD-10-CM | POA: Insufficient documentation

## 2012-08-29 HISTORY — DX: Pure hypercholesterolemia, unspecified: E78.00

## 2012-08-29 HISTORY — DX: ST elevation (STEMI) myocardial infarction involving other coronary artery of inferior wall: I21.19

## 2012-08-29 LAB — BASIC METABOLIC PANEL
Calcium: 9.9 mg/dL (ref 8.4–10.5)
GFR calc non Af Amer: >90 mL/min (ref >90)
Glucose, Bld: 107 mg/dL — ABNORMAL HIGH (ref 70–99)
Sodium: 139 mEq/L (ref 135–145)

## 2012-08-29 LAB — CBC
HCT: 44.1 % (ref 36.0–46.0)
Hemoglobin: 14.7 g/dL (ref 12.0–15.0)
MCV: 89.8 fL (ref 78.0–100.0)
RBC: 4.91 MIL/uL (ref 3.87–5.11)
WBC: 11 10*3/uL — ABNORMAL HIGH (ref 4.0–10.5)

## 2012-08-29 LAB — POCT I-STAT TROPONIN I: Troponin i, poc: 0 ng/mL (ref 0.00–0.08)

## 2012-08-29 MED ORDER — MORPHINE SULFATE 4 MG/ML IJ SOLN
4.0000 mg | Freq: Once | INTRAMUSCULAR | Status: DC
  Filled 2012-08-29: qty 1

## 2012-08-29 MED ORDER — ASPIRIN 325 MG PO TABS
325.0000 mg | ORAL_TABLET | ORAL | Status: AC
  Administered 2012-08-29: 325 mg via ORAL
  Filled 2012-08-29: qty 1

## 2012-08-29 MED ORDER — NITROGLYCERIN 2 % TD OINT
1.0000 [in_us] | TOPICAL_OINTMENT | Freq: Four times a day (QID) | TRANSDERMAL | Status: DC
  Filled 2012-08-29: qty 1

## 2012-08-29 NOTE — ED Notes (Signed)
sts intermitent CP today with pain in jaw, also c/o SOB, took 1 nitro pta with minimal relief, took 81 ASA this AM, pain 1/10 on arrival

## 2012-08-29 NOTE — H&P (Signed)
Physician History and Physical    [name] MRN: [id number] DOB/AGE: 05-Jan-1951 61 y.o. Admit date: 08/29/2012  Primary Cardiologist:  Previously Dr. Sharyn Lull, Now following with Dr. Bruna Potter at [facility] office): [phone]  CC:  Chest pain  HPI:  Pt is a 61 yo woman smoker with HTN, HLD, fam hx CAD, inferoposterior STEMI s/p mult xience stents to RCA 9/13 complicated by VF arrest who presents with chest pain.  Events surrounding MI in 9/13 were obtained from Dr. Annitta Jersey note, no cath report seen in Epic.  Pt does not want to continue to follow with Dr. Sharyn Lull and requested to be admitted by a different cardiologist.  She reports that chest pain started at noon today - pain was dull, in the center of her chest, radiating to R chest.  The pain was on and off every 30 sec for about an hour.  Then the pain moved back to the center of her chest.  The pain has continued on and off for the next 12 hours, with very short episodes and episodes lasting up to 30 mins.  During some of the episodes, the pain radiated to her jaw and she also had some teeth pain with it.  She was also SOB.  At this time, she is chest pain free.  She is still having twinges of pain in her jaw intermittently.  She tried nitro for the pain, but it didn't seem to do anything.  She reports she is compliant with all of her medications.  The pain she has had today feels different compared to her prior MI - during her STEMI, she had burning epigastric pain primarily that was happening for about a month prior to her STEMI.  She also had some teeth pain at that time.  She has no other acute complaints.  Of note, pt reports that she had what sounds like a regadenson nuclear stress last week with her primary cardiologist and she was told that it was negative.         Review of systems: A review of 10 organ systems was done and is negative except as stated above in HPI.  She has had some occ LE edema, but none  today   Past Medical History  Diagnosis Date  . Hypertension   . GERD (gastroesophageal reflux disease)   . MI, old   . Hypercholesteremia    Past Surgical History  Procedure Date  . Tonsillectomy   . Appendectomy   . Vulvectomy   . Breast surgery    History   Social History  . Marital Status: Divorced    Spouse Name: N/A    Number of Children: N/A  . Years of Education: N/A   Occupational History  . Not on file.   Social History Main Topics  . Smoking status: Current Every Day Smoker -- 0.5 packs/day  . Smokeless tobacco: Not on file  . Alcohol Use: No  . Drug Use:   . Sexually Active:    Other Topics Concern  . Not on file   Social History Narrative  . No narrative on file    Family hx: mother with 3 MIs, earliest at age 90, CABG x 2 Brother died of MI at age 51  Allergies  Allergen Reactions  . Ciprofloxacin Anaphylaxis  . Codeine Other (See Comments)    Unknown reaction  . Erythromycin Nausea And Vomiting  . Penicillins Rash  . Sulfa Antibiotics Rash     (  Not in a hospital admission)  Current facility-administered medications:morphine 4 MG/ML injection 4 mg, 4 mg, Intravenous, Once, Celene Kras, MD;  nitroGLYCERIN (NITROGLYN) 2 % ointment 1 inch, 1 inch, Topical, Q6H, Celene Kras, MD Current outpatient prescriptions:acetaminophen (TYLENOL) 500 MG tablet, Take 500-1,000 mg by mouth every 6 (six) hours as needed. For pain, Disp: , Rfl: ;  albuterol (PROVENTIL HFA;VENTOLIN HFA) 108 (90 BASE) MCG/ACT inhaler, Inhale 2 puffs into the lungs every 6 (six) hours as needed. For wheezing, Disp: , Rfl: ;  ALPRAZolam (XANAX) 0.5 MG tablet, Take 0.25 mg by mouth 2 (two) times daily., Disp: , Rfl:  aspirin EC 81 MG EC tablet, Take 1 tablet (81 mg total) by mouth daily., Disp: 30 tablet, Rfl: 3;  budesonide-formoterol (SYMBICORT) 160-4.5 MCG/ACT inhaler, Inhale 1 puff into the lungs daily., Disp: , Rfl: ;  lisinopril (PRINIVIL,ZESTRIL) 10 MG tablet, Take 10 mg by mouth  every evening., Disp: , Rfl: ;  metoprolol tartrate (LOPRESSOR) 25 MG tablet, Take 12.5 mg by mouth 2 (two) times daily., Disp: , Rfl:  nitroGLYCERIN (NITROSTAT) 0.4 MG SL tablet, Place 1 tablet (0.4 mg total) under the tongue every 5 (five) minutes x 3 doses as needed for chest pain., Disp: 25 tablet, Rfl: 3;  omeprazole (PRILOSEC) 20 MG capsule, Take 20 mg by mouth 2 (two) times daily. For acid reflux, Disp: , Rfl: ;  pravastatin (PRAVACHOL) 40 MG tablet, Take 40 mg by mouth every evening., Disp: , Rfl:  ranitidine (ZANTAC) 75 MG tablet, Take 75 mg by mouth 2 (two) times daily. For upset stomach, Disp: , Rfl: ;  Ticagrelor (BRILINTA) 90 MG TABS tablet, Take 1 tablet (90 mg total) by mouth 2 (two) times daily., Disp: 60 tablet, Rfl: 11  Physical Exam: Blood pressure 156/85, pulse 75, temperature 98.1 F (36.7 C), temperature source Oral, resp. rate 17, height 5\' 4"  (1.626 m), weight 81.647 kg (180 lb), SpO2 99.00%.; Body mass index is 30.90 kg/(m^2). Temp:  [98.1 F (36.7 C)] 98.1 F (36.7 C) (12/25 2058) Pulse Rate:  [75] 75  (12/25 2200) Resp:  [17-20] 17  (12/25 2200) BP: (140-156)/(85-88) 156/85 mmHg (12/25 2200) SpO2:  [98 %-99 %] 99 % (12/25 2200) Weight:  [81.647 kg (180 lb)] 81.647 kg (180 lb) (12/25 2058)  No intake or output data in the 24 hours ending 08/29/12 2245 General: NAD Heent: MMM Neck: No JVD  CV: RRR, no m Lungs: Clear to auscultation bilaterally with normal respiratory effort Abdomen: Soft, nontender, nondistended Extremities: No clubbing or cyanosis. No pedal edema Skin: Intact without lesions or rashes  Neurologic: Alert and oriented x3, grossly nonfocal  Psych: Normal mood and affect    Labs: No results found for this basename: CKTOTAL:4,CKMB:4,TROPONINI:4 in the last 72 hours Lab Results  Component Value Date   WBC 11.0* 08/29/2012   HGB 14.7 08/29/2012   HCT 44.1 08/29/2012   MCV 89.8 08/29/2012   PLT 294 08/29/2012    Lab 08/29/12 2115  NA 139  K  3.7  CL 103  CO2 23  BUN 11  CREATININE 0.57  CALCIUM 9.9  PROT --  BILITOT --  ALKPHOS --  ALT --  AST --  GLUCOSE 107*   Lab Results  Component Value Date   CHOL 210* 05/25/2012   HDL 31* 05/25/2012   LDLCALC 143* 05/25/2012   TRIG 179* 05/25/2012   Trop 0.00 BNP 433    EKG:  Sinus, inf q waves, nonspec ST changes unchanged from prior EKG  9/13   Radiology:  Dg Chest Port 1 View  08/29/2012  *RADIOLOGY REPORT*  Clinical Data: Chest pain.  Prior history of MI with coronary stenting.  Long-time smoker.  PORTABLE CHEST - 1 VIEW 08/29/2012 2137 hours:  Comparison: Two-view chest x-ray 05/25/2012, 08/08/2006.  Findings: Suboptimal inspiration accounts for crowded bronchovascular markings, especially in the lung bases, and accentuates the cardiac silhouette.  Taking into account, cardiac silhouette upper normal in size to slightly enlarged.  Lungs clear. No visible pleural effusions.  IMPRESSION: Suboptimal inspiration.  No acute cardiopulmonary disease.   Original Report Authenticated By: Hulan Saas, M.D.     ASSESSMENT:  Pt is a 61 yo woman smoker with HTN, HLD, fam hx CAD, inferoposterior STEMI s/p mult xience stents to RCA 9/13 complicated by VF arrest who presents with chest pain  PLAN:  Chest pain - sounds atypical in nature and pt describes it as different than her pain with recent STEMI.  EKG with no acute ischemic changes, first troponin negative - pt had stress test done with her primary cardiologist last week - Dr. Bruna Potter [phone] - will need to call tomorrow and obtain results.  Pt was told it was negative - continue aspirin, brilinta, statin, BB, ACEi - pt is currently CP free - try SL nitro or morphine prn for recurrence - no need for heparin gtt at this time - low suspicion for ACS given atypical nature of pain and recent negative stress test  - trend ezymes - serial EKGs - monitor on tele - try to obtain recent EF assessment - she had echo when in  hospital for STEMI, though results not in EPIC.  Will try and obtain records or repeat echo tomorrow (pt with slightly elevated BNP and SOB) - if pt remains CP free and enzymes remain negative and recent stress test is confirmed negative - this pain is probably not cardiac and she can f/u with her outpt cardiologist  - if enzymes uptrend - will start heparin and plan for cath to further eval  HTN/HLD - continue home meds  COPD - continue home meds  GERD - continue home meds  Tobacco abuse - pt has cut down to <1/2 ppd, but uses e-cigarette, so difficult to quantify.  She is not ready to quit smoking at this time  Hx NSVT during hospitalization for STEMI - pt is currently wearing a monitor from her outpt cardiologist  Dispo - admit to [facility].  Heparin for DVT ppx.  NPO p MN in case enzymes uptrend overnight  Signed: Hilary Hertz, MD Cardiology Fellow 08/29/2012, 10:45 PM

## 2012-08-29 NOTE — ED Provider Notes (Addendum)
History    CSN: [id number] Arrival date & time 08/29/12  2051 First MD Initiated Contact with Patient 08/29/12 2116    Patient's current cardiologist is at [facility] Complaint  Patient presents with  . Chest Pain     Patient is a 61 y.o. female presenting with chest pain. The history is provided by the patient.  Chest Pain The chest pain began 6 - 12 hours ago (today). Duration of episode(s) is 1 hour. Chest pain occurs frequently. The chest pain is unchanged. The severity of the pain is moderate. The quality of the pain is described as dull, burning and tightness. Radiates to: some pain in her teeth. Pertinent negatives for primary symptoms include no fever and no cough. Associated symptoms comments: Some shortness of breath .  Her past medical history is significant for CAD.  Procedure history is positive for cardiac catheterization.    patient has had cardiac catheterization and stents placed in September of this year. She states the symptoms are somewhat similar but she's not sure if they're exactly the same the patient did take nitroglycerin at home with some partial relief but the symptoms have not completely resolved.  Past Medical History  Diagnosis Date  . Hypertension   . GERD (gastroesophageal reflux disease)   . Hypercholesteremia   . ST elevation myocardial infarction (STEMI) of inferoposterior wall September 2013    S/P DES X 3 RCA, EF 50%    Past Surgical History  Procedure Date  . Tonsillectomy   . Appendectomy   . Vulvectomy   . Breast surgery     No family history on file.  History  Substance Use Topics  . Smoking status: Current Every Day Smoker -- 0.5 packs/day  . Smokeless tobacco: Not on file  . Alcohol Use: No    OB History    Grav Para Term Preterm Abortions TAB SAB Ect Mult Living                  Review of Systems  Constitutional: Negative for fever.  Respiratory: Negative for cough.   Cardiovascular: Positive for chest pain.   All other systems reviewed and are negative.    Allergies  Ciprofloxacin; Codeine; Erythromycin; Penicillins; and Sulfa antibiotics  Home Medications   Current Outpatient Rx  Name  Route  Sig  Dispense  Refill  . ACETAMINOPHEN 500 MG PO TABS   Oral   Take 500-1,000 mg by mouth every 6 (six) hours as needed. For pain         . ALBUTEROL SULFATE HFA 108 (90 BASE) MCG/ACT IN AERS   Inhalation   Inhale 2 puffs into the lungs every 6 (six) hours as needed. For wheezing         . ALPRAZOLAM 0.5 MG PO TABS   Oral   Take 0.25 mg by mouth 2 (two) times daily.         . ASPIRIN 81 MG PO TBEC   Oral   Take 1 tablet (81 mg total) by mouth daily.   30 tablet   3   . BUDESONIDE-FORMOTEROL FUMARATE 160-4.5 MCG/ACT IN AERO   Inhalation   Inhale 1 puff into the lungs daily.         Marland Kitchen LISINOPRIL 10 MG PO TABS   Oral   Take 10 mg by mouth every evening.         Marland Kitchen METOPROLOL TARTRATE 25 MG PO TABS   Oral   Take 12.5 mg by mouth  2 (two) times daily.         Marland Kitchen NITROGLYCERIN 0.4 MG SL SUBL   Sublingual   Place 1 tablet (0.4 mg total) under the tongue every 5 (five) minutes x 3 doses as needed for chest pain.   25 tablet   3   . PRAVASTATIN SODIUM 40 MG PO TABS   Oral   Take 40 mg by mouth every evening.         Marland Kitchen TICAGRELOR 90 MG PO TABS   Oral   Take 1 tablet (90 mg total) by mouth 2 (two) times daily.   60 tablet   11   . PANTOPRAZOLE SODIUM 40 MG PO TBEC   Oral   Take 1 tablet (40 mg total) by mouth 2 (two) times daily before lunch and supper.   60 tablet   11     BP 107/71  Pulse 70  Temp 98.1 F (36.7 C) (Oral)  Resp 18  Ht 5\' 4"  (1.626 m)  Wt 178 lb 12.7 oz (81.1 kg)  BMI 30.69 kg/m2  SpO2 97%  Physical Exam  Nursing note and vitals reviewed. Constitutional: No distress.  HENT:  Head: Normocephalic and atraumatic.  Right Ear: External ear normal.  Left Ear: External ear normal.  Eyes: Conjunctivae normal are normal. Right eye exhibits  no discharge. Left eye exhibits no discharge. No scleral icterus.  Neck: Neck supple. No tracheal deviation present.  Cardiovascular: Normal rate, regular rhythm and intact distal pulses.   Pulmonary/Chest: Effort normal and breath sounds normal. No stridor. No respiratory distress. She has no wheezes. She has no rales.  Abdominal: Soft. Bowel sounds are normal. She exhibits no distension. There is no tenderness. There is no rebound and no guarding.  Musculoskeletal: She exhibits no edema and no tenderness.  Neurological: She is alert. She has normal strength. No sensory deficit. Cranial nerve deficit:  no gross defecits noted. She exhibits normal muscle tone. She displays no seizure activity. Coordination normal.  Skin: Skin is warm and dry. No rash noted. She is not diaphoretic.  Psychiatric: She has a normal mood and affect.    ED Course  Procedures (including critical care time) Old records reviewed 05/24/2012 CARDIAC CATHETERIZATION  PROCEDURE:  1. Left cardiac catheterization with selective left and right coronary  angiography via left groin using Judkins technique.  2. Insertion of temporary transvenous pacemaker via right femoral  venous approach.  3. Percutaneous transluminal coronary angioplasty to proximal, mid and  distal right coronary artery using 2.5 x 12 and 2.5 x 20 mm long,  Emerge monorail balloon.  4. Aspiration thrombectomy of the right coronary artery using Xpress-  Way catheter.  5. Aspiration atherectomy using Possis AngioJet catheter.  6. Successful deployment of 2.5 x 38 mm long Xience drug-eluting stent  in distal right coronary artery.  7. Successful deployment of 2.75 x 38 mm long Xience Xpedition drug-  eluting stent in mid right coronary artery.  8. Successful deployment of 3.0 x 38 mm long Xience Xpedition drug-  eluting stent in proximal right coronary artery.  9. Successful postdilatation of the stents using 3.0 x 20 mm long Hartley  Trek balloon.     Rate:77  Rhythm: normal sinus rhythm  QRS Axis: normal  Intervals: normal  ST/T Wave abnormalities: No acute ST-T wave changes Conduction Disutrbances:none  Narrative Interpretation: Possible lateral infarct, possible inferior infarct age undetermined  Old EKG Reviewed: none available  Labs Reviewed  CBC - Abnormal; Notable for the following:  WBC 11.0 (*)     All other components within normal limits  BASIC METABOLIC PANEL - Abnormal; Notable for the following:    Glucose, Bld 107 (*)     All other components within normal limits  PRO B NATRIURETIC PEPTIDE - Abnormal; Notable for the following:    Pro B Natriuretic peptide (BNP) 433.7 (*)     All other components within normal limits  BASIC METABOLIC PANEL - Abnormal; Notable for the following:    Potassium 3.3 (*)     Glucose, Bld 137 (*)     All other components within normal limits  POCT I-STAT TROPONIN I  TROPONIN I  TROPONIN I  TROPONIN I  CBC  CK TOTAL AND CKMB  LAB REPORT - SCANNED   No results found.   1. Chest pain   2. Coronary artery disease   3. Hypertension       MDM  Patient has known history of coronary artery disease. Patient is having recurrent symptoms just 3 minutes out from her recent stents.  Fortunately patient is not having any active chest pain at this time. However, I do feel that she requires hospitalization for serial enzymes and further evaluation.  The patient had seen Dr. Cleatis Polka when she had a heart attack. She switched to Dr[facility] but lives here in [place]. Patient would prefer to see the unassigned cardiologist on call.        Celene Kras, MD 09/01/12 1610  Celene Kras, MD 09/10/12 [phone]

## 2012-08-30 ENCOUNTER — Encounter (HOSPITAL_COMMUNITY): Payer: Self-pay | Admitting: Physician Assistant

## 2012-08-30 DIAGNOSIS — K219 Gastro-esophageal reflux disease without esophagitis: Secondary | ICD-10-CM | POA: Diagnosis present

## 2012-08-30 DIAGNOSIS — I2510 Atherosclerotic heart disease of native coronary artery without angina pectoris: Secondary | ICD-10-CM

## 2012-08-30 DIAGNOSIS — R079 Chest pain, unspecified: Secondary | ICD-10-CM

## 2012-08-30 DIAGNOSIS — I10 Essential (primary) hypertension: Secondary | ICD-10-CM

## 2012-08-30 DIAGNOSIS — R0789 Other chest pain: Secondary | ICD-10-CM | POA: Diagnosis present

## 2012-08-30 LAB — BASIC METABOLIC PANEL
BUN: 10 mg/dL (ref 6–23)
Calcium: 9.4 mg/dL (ref 8.4–10.5)
Creatinine, Ser: 0.62 mg/dL (ref 0.50–1.10)
GFR calc non Af Amer: >90 mL/min (ref >90)
Glucose, Bld: 137 mg/dL — ABNORMAL HIGH (ref 70–99)

## 2012-08-30 LAB — CBC
HCT: 41.3 % (ref 36.0–46.0)
Hemoglobin: 13.7 g/dL (ref 12.0–15.0)
MCH: 29.9 pg (ref 26.0–34.0)
MCHC: 33.2 g/dL (ref 30.0–36.0)

## 2012-08-30 MED ORDER — PANTOPRAZOLE SODIUM 40 MG PO TBEC: 40.0000 mg | DELAYED_RELEASE_TABLET | Freq: Two times a day (BID) | ORAL | Status: DC

## 2012-08-30 MED ORDER — BUDESONIDE-FORMOTEROL FUMARATE 160-4.5 MCG/ACT IN AERO
1.0000 | INHALATION_SPRAY | Freq: Every day | RESPIRATORY_TRACT | Status: DC
  Administered 2012-08-30: 1 via RESPIRATORY_TRACT
  Filled 2012-08-30: qty 6

## 2012-08-30 MED ORDER — ALPRAZOLAM 0.25 MG PO TABS
0.2500 mg | ORAL_TABLET | Freq: Two times a day (BID) | ORAL | Status: DC
  Administered 2012-08-30: 0.25 mg via ORAL
  Filled 2012-08-30: qty 1

## 2012-08-30 MED ORDER — MORPHINE SULFATE 2 MG/ML IJ SOLN: 2.0000 mg | INTRAMUSCULAR | Status: DC | PRN

## 2012-08-30 MED ORDER — FAMOTIDINE 20 MG PO TABS
20.0000 mg | ORAL_TABLET | Freq: Every day | ORAL | Status: DC
  Filled 2012-08-30: qty 1

## 2012-08-30 MED ORDER — ONDANSETRON HCL 4 MG/2ML IJ SOLN: 4.0000 mg | Freq: Four times a day (QID) | INTRAMUSCULAR | Status: DC | PRN

## 2012-08-30 MED ORDER — LISINOPRIL 10 MG PO TABS
10.0000 mg | ORAL_TABLET | Freq: Every evening | ORAL | Status: DC
  Filled 2012-08-30: qty 1

## 2012-08-30 MED ORDER — NITROGLYCERIN 0.4 MG SL SUBL: 0.4000 mg | SUBLINGUAL_TABLET | SUBLINGUAL | Status: DC | PRN

## 2012-08-30 MED ORDER — TICAGRELOR 90 MG PO TABS
90.0000 mg | ORAL_TABLET | Freq: Two times a day (BID) | ORAL | Status: DC
  Administered 2012-08-30: 90 mg via ORAL
  Filled 2012-08-30 (×3): qty 1

## 2012-08-30 MED ORDER — PANTOPRAZOLE SODIUM 40 MG PO TBEC: 40.0000 mg | DELAYED_RELEASE_TABLET | Freq: Every day | ORAL | Status: DC

## 2012-08-30 MED ORDER — GI COCKTAIL ~~LOC~~
30.0000 mL | Freq: Three times a day (TID) | ORAL | Status: DC | PRN
  Filled 2012-08-30: qty 30

## 2012-08-30 MED ORDER — SIMVASTATIN 20 MG PO TABS
20.0000 mg | ORAL_TABLET | Freq: Every day | ORAL | Status: DC
  Filled 2012-08-30: qty 1

## 2012-08-30 MED ORDER — HEPARIN SODIUM (PORCINE) 5000 UNIT/ML IJ SOLN
5000.0000 [IU] | Freq: Three times a day (TID) | INTRAMUSCULAR | Status: DC
  Administered 2012-08-30: 5000 [IU] via SUBCUTANEOUS
  Filled 2012-08-30 (×4): qty 1

## 2012-08-30 MED ORDER — METOPROLOL TARTRATE 12.5 MG HALF TABLET
12.5000 mg | ORAL_TABLET | Freq: Two times a day (BID) | ORAL | Status: DC
  Administered 2012-08-30: 12.5 mg via ORAL
  Filled 2012-08-30 (×3): qty 1

## 2012-08-30 MED ORDER — POTASSIUM CHLORIDE CRYS ER 20 MEQ PO TBCR
40.0000 meq | EXTENDED_RELEASE_TABLET | Freq: Once | ORAL | Status: AC
  Administered 2012-08-30: 40 meq via ORAL
  Filled 2012-08-30: qty 2

## 2012-08-30 MED ORDER — ACETAMINOPHEN 500 MG PO TABS
500.0000 mg | ORAL_TABLET | Freq: Four times a day (QID) | ORAL | Status: DC | PRN
  Administered 2012-08-30: 1000 mg via ORAL
  Filled 2012-08-30 (×2): qty 2

## 2012-08-30 MED ORDER — ALBUTEROL SULFATE HFA 108 (90 BASE) MCG/ACT IN AERS: 2.0000 | INHALATION_SPRAY | Freq: Four times a day (QID) | RESPIRATORY_TRACT | Status: DC | PRN

## 2012-08-30 MED ORDER — ASPIRIN EC 81 MG PO TBEC
81.0000 mg | DELAYED_RELEASE_TABLET | Freq: Every day | ORAL | Status: DC
  Administered 2012-08-30: 81 mg via ORAL
  Filled 2012-08-30: qty 1

## 2012-08-30 NOTE — Progress Notes (Signed)
Patient Name: [name] Date of Encounter: 08/30/2012  Active Problems:  Chest pain, mid sternal  GERD (gastroesophageal reflux disease)  Hypertension    SUBJECTIVE: Still with intermittent lower sternal/mid-epigastric pain, 1/10 at times, not sure SL NTG taken at home helped as each episode of pain has been brief (no exertional component). Tylenol no help. Pain has been sharp at lower right rib edge, burning  and dull at epigastric/lower sternal edge. She is also concerned because she needs an MRI of her hip and wants to make sure this will not affect her stents.  OBJECTIVE Filed Vitals:   08/30/12 0000 08/30/12 0100 08/30/12 0142 08/30/12 0607  BP: 123/80 125/70 111/78 134/74  Pulse: 68 66 77 75  Temp:   97.5 F (36.4 C) 98 F (36.7 C)  TempSrc:    Oral  Resp: 19 18  18   Height:      Weight:   178 lb 12.7 oz (81.1 kg)   SpO2: 96% 96% 97% 97%   No intake or output data in the 24 hours ending 08/30/12 0829 Filed Weights   08/29/12 2058 08/30/12 0142  Weight: 180 lb (81.647 kg) 178 lb 12.7 oz (81.1 kg)     PHYSICAL EXAM General: Well developed, well nourished, female in no acute distress. Head: Normocephalic, atraumatic.  Neck: Supple without bruits, JVD not elevated. Lungs:  Resp regular and unlabored, very few rales. Heart: RRR, S1, S2, no S3, S4, or murmur. Abdomen: Soft, non-tender, non-distended, BS + x 4.  Extremities: No clubbing, cyanosis, no edema.  Neuro: Alert and oriented X 3. Moves all extremities spontaneously. Psych: Normal affect.  LABS: CBC:  Basename 08/30/12 0149 08/29/12 2115  WBC 9.7 11.0*  NEUTROABS -- --  HGB 13.7 14.7  HCT 41.3 44.1  MCV 90.2 89.8  PLT 261 294   INR:No results found for this basename: INR in the last 72 hours Basic Metabolic Panel:  Basename 08/30/12 0149 08/29/12 2115  NA 139 139  K 3.3* 3.7  CL 103 103  CO2 25 23  GLUCOSE 137* 107*  BUN 10 11  CREATININE 0.62 0.57  CALCIUM 9.4 9.9  MG -- --  PHOS --  --   Cardiac Enzymes:  Basename 08/30/12 0157  CKTOTAL --  CKMB --  CKMBINDEX --  TROPONINI <0.30    Basename 08/29/12 2119  TROPIPOC 0.00   BNP: Pro B Natriuretic peptide (BNP)  Date/Time Value Range Status  08/29/2012  9:15 PM 433.7* 0 - 125 pg/mL Final   TELE:    SR    Cath report from 05/24/2012 LV showed good LV systolic function. There was mild-to-  moderate inferior wall hypokinesia, EF of approximately 50%. Left main  has 40-50% ostial and 20-25% distal stenosis. LAD has 20-30% proximal  and mid stenosis. Diagonal 1 and 2 were small which were patent. Ramus  was small which was patent. Left circumflex has 30-40% proximal  stenosis with the bifurcation with OM 1. OM 1 is patent. RCA was 100%  occluded beyond proximal portion filling faintly distally from  collaterals from left system. RCA had large thrombus burden. INTERVENTIONAL PROCEDURE:  1. PTCA to proximal RCA was done using 2.5 x 12 mm long, Emerge  balloon for predilatation. Angiogram showed no flow with large  burden of thrombus.  2. Aspiration thrombectomy was done using Xpress-Way catheter, one  pass was done without any improvement in blood flow.  3. PTCA to mid and proximal RCA was done using 2.5 x  20 mm long Emerge  monorail balloon. Multiple inflations were done without any improvement in blood flow.  4. PTCA to distal RCA was done using 2.0 x 20 mm long, Emerge monorail  balloon. Multiple inflations were done, and using 2.5 x 20 mm  long, Emerge monorail balloon in mid and proximal portion without  any improvement in the blood flow.  5. Aspiration thrombectomy was done using [place] AngioJet going all  the way up to the distal portion of the vessel. Angiogram showed  TIMI 0 distal flow without any visualization of the vessel distally.  6. A 2.5 x 38 mm long, Xience, Xpedition drug-eluting stent was  deployed in distal RCA at 11 atmospheric pressure, and then 2.75 x  38 mm long, Xience, Xpedition  drug-eluting stent was deployed in  mid RCA overlapping with the distal stent, and then 3.0 x 38 mm  long, Xience, Xpedition drug-eluting stent was deployed at 11  atmospheric pressure and proximal RCA at 11 atmospheric pressure.  The stents were post dilated using 3.0 x 20 mm long [place] Trek balloon  going from 10 atmospheric pressure in distal, 15 atmospheric  pressure in mid, and 18 atmospheric pressure in proximal RCA.  Lesions dilated from 100% to 0% residual with excellent final TIMI  grade 3 distal flow without evidence of dissection or distal  embolization. The patient did not had 1 episode of VFib during the  procedure requiring defibrillation during insertion of temporary  transvenous pacemaker. Temporary pacemaker was pulled back up to  the right atrium and was discontinued at the end of the procedure. The patient received  weight based Angiomax, Integrilin, 180 mg of Brilinta and aspirin  during the procedure. The patient tolerated the procedure well.  There were no complications. The patient was transferred to  recovery room in stable condition.  ECG: 30-Aug-2012 06:25:07 [facility] ROUTINE RECORD Normal sinus rhythm Possible Inferior infarct , age undetermined Abnormal ECG 52mm/s 72mm/mV 100Hz  8.0.1 12SL 241 HD CID: 1 Referred by: BENSIMHON DANIEL Unconfirmed Vent. rate 66 BPM PR interval 178 ms QRS duration 76 ms QT/QTc 428/448 ms P-R-T axes 29 -19 -15  Radiology/Studies: Dg Chest Port 1 View 08/29/2012  *RADIOLOGY REPORT*  Clinical Data: Chest pain.  Prior history of MI with coronary stenting.  Long-time smoker.  PORTABLE CHEST - 1 VIEW 08/29/2012 2137 hours:  Comparison: Two-view chest x-ray 05/25/2012, 08/08/2006.  Findings: Suboptimal inspiration accounts for crowded bronchovascular markings, especially in the lung bases, and accentuates the cardiac silhouette.  Taking into account, cardiac silhouette upper normal in size to slightly enlarged.   Lungs clear. No visible pleural effusions.  IMPRESSION: Suboptimal inspiration.  No acute cardiopulmonary disease.   Original Report Authenticated By: Hulan Saas, M.D.     Current Medications:     . ALPRAZolam  0.25 mg Oral BID  . aspirin EC  81 mg Oral Daily  . budesonide-formoterol  1 puff Inhalation Daily  . famotidine  20 mg Oral Daily  . heparin  5,000 Units Subcutaneous Q8H  . lisinopril  10 mg Oral QPM  . metoprolol tartrate  12.5 mg Oral BID  . pantoprazole  40 mg Oral Daily  . simvastatin  20 mg Oral q1800  . Ticagrelor  90 mg Oral BID      ASSESSMENT AND PLAN: Active Problems:  Chest pain, mid sternal - Will try GI cocktail, get records on recent stress test from [place], see cath report above, no other significant CAD and no  ECG changes to suggest stent closure.  Will try GI cocktail. Ck CKMB with next troponin. If ez negative and recent stress test OK, MD advise on further eval.   **Stress test from Dr Leeann Must - basal inferior scar, no ischemia, EF 53%, 08/2012.  Otherwise, continue current Rx, OK to eat, increase Protonix to BID.  GERD (gastroesophageal reflux disease)  Hypertension   Signed, Theodore Demark , PA-C 8:29 AM 08/30/2012   I have seen, examined the patient, and reviewed the above assessment and plan.  Changes to above are made where necessary.  Pt presently doing well and pain free.  She wants to go home.  Her CMs are negative and I have reviewed her recent stress test result which was without ischemia.  By history, her pain is atypical.  I think that discharge to home with outpatient follow-up by her primary cardiologist is appropriate.  She voices willingness to comply.  No medicine changes at this point.  Smoking cessation is strongly advised.  She will try to quit.  Co Sign: Hillis Range, MD 08/30/2012 4:26 PM

## 2012-08-30 NOTE — Discharge Summary (Signed)
CARDIOLOGY DISCHARGE SUMMARY   Patient ID: [name] MRN: [id number] DOB/AGE: Nov 24, 1950 61 y.o.  Admit date: 08/29/2012 Discharge date: 08/30/2012  Primary Discharge Diagnosis:   Chest pain, mid sternal Secondary Discharge Diagnosis:   GERD (gastroesophageal reflux disease)  Hypertension  Procedures:  CXR  Hospital Course: [name] is a 61 year old female with a recent history of CAD. She had a STEMI in September 2013 treated with DES x 3 and had a stress test for some chest pain 2 weeks ago. She had intermittent chest pain and came to the hospital where she was admitted for further evaluation and treatment.   Her cardiac enzymes were negative for MI. Her ECG was not acute. She continued to have intermittent pain, but it may have been helped by a GI cocktail. She did not think Tylenol or NTG was helpful. She had a recent stress test by [facility] and those records were obtained. It showed a scar but no ischemia, EF 53%. Dr Johney Frame reviewed the results with [name]. Her symptoms were atypical for cardiac pain and are improved. She is considered stable for discharge, to follow up as an outpatient with Dr Enzo Bi.  Labs:   Lab Results  Component Value Date   WBC 9.7 08/30/2012   HGB 13.7 08/30/2012   HCT 41.3 08/30/2012   MCV 90.2 08/30/2012   PLT 261 08/30/2012    Lab 08/30/12 0149  NA 139  K 3.3*  CL 103  CO2 25  BUN 10  CREATININE 0.62  CALCIUM 9.4  PROT --  BILITOT --  ALKPHOS --  ALT --  AST --  GLUCOSE 137*    Basename 08/30/12 1425 08/30/12 0820 08/30/12 0157  CKTOTAL -- 53 --  CKMB -- 2.1 --  CKMBINDEX -- -- --  TROPONINI <0.30 <0.30 <0.30    Pro B Natriuretic peptide (BNP)  Date/Time Value Range Status  08/29/2012  9:15 PM 433.7* 0 - 125 pg/mL Final       Radiology: Dg Chest Port 1 View 08/29/2012  *RADIOLOGY REPORT*  Clinical Data: Chest pain.  Prior history of MI with coronary stenting.  Long-time smoker.  PORTABLE CHEST - 1 VIEW 08/29/2012 2137  hours:  Comparison: Two-view chest x-ray 05/25/2012, 08/08/2006.  Findings: Suboptimal inspiration accounts for crowded bronchovascular markings, especially in the lung bases, and accentuates the cardiac silhouette.  Taking into account, cardiac silhouette upper normal in size to slightly enlarged.  Lungs clear. No visible pleural effusions.  IMPRESSION: Suboptimal inspiration.  No acute cardiopulmonary disease.   Original Report Authenticated By: Hulan Saas, M.D.    EKG:  30-Aug-2012 06:25:07 [facility] ROUTINE RECORD Normal sinus rhythm Possible Inferior infarct , age undetermined Abnormal ECG 30mm/s 46mm/mV 100Hz  8.0.1 12SL 241 HD CID: 1 Referred by: BENSIMHON DANIEL Unconfirmed Vent. rate 66 BPM PR interval 178 Kristina QRS duration 76 Kristina QT/QTc 428/448 Kristina P-R-T axes 29 -19 -15  FOLLOW UP PLANS AND APPOINTMENTS Allergies  Allergen Reactions  . Ciprofloxacin Anaphylaxis  . Codeine Other (See Comments)    Unknown reaction  . Erythromycin Nausea And Vomiting  . Penicillins Rash  . Sulfa Antibiotics Rash     Medication List     As of 08/30/2012  3:51 PM    STOP taking these medications         omeprazole 20 MG capsule   Commonly known as: PRILOSEC      ranitidine 75 MG tablet   Commonly known as: ZANTAC  TAKE these medications         acetaminophen 500 MG tablet   Commonly known as: TYLENOL   Take 500-1,000 mg by mouth every 6 (six) hours as needed. For pain      albuterol 108 (90 BASE) MCG/ACT inhaler   Commonly known as: PROVENTIL HFA;VENTOLIN HFA   Inhale 2 puffs into the lungs every 6 (six) hours as needed. For wheezing      ALPRAZolam 0.5 MG tablet   Commonly known as: XANAX   Take 0.25 mg by mouth 2 (two) times daily.      aspirin 81 MG EC tablet   Take 1 tablet (81 mg total) by mouth daily.      budesonide-formoterol 160-4.5 MCG/ACT inhaler   Commonly known as: SYMBICORT   Inhale 1 puff into the lungs daily.      lisinopril 10  MG tablet   Commonly known as: PRINIVIL,ZESTRIL   Take 10 mg by mouth every evening.      metoprolol tartrate 25 MG tablet   Commonly known as: LOPRESSOR   Take 12.5 mg by mouth 2 (two) times daily.      nitroGLYCERIN 0.4 MG SL tablet   Commonly known as: NITROSTAT   Place 1 tablet (0.4 mg total) under the tongue every 5 (five) minutes x 3 doses as needed for chest pain.      pantoprazole 40 MG tablet   Commonly known as: PROTONIX   Take 1 tablet (40 mg total) by mouth 2 (two) times daily before lunch and supper.      pravastatin 40 MG tablet   Commonly known as: PRAVACHOL   Take 40 mg by mouth every evening.      Ticagrelor 90 MG Tabs tablet   Commonly known as: BRILINTA   Take 1 tablet (90 mg total) by mouth 2 (two) times daily.       Follow-up Information    Follow up with Renaldo, Tiffany Kocher, MD. (Keep your scheduled follow-up with your primary cardiologist (please follow up within 3 weeks). If you wish to change practices, the [employer] office number is [phone].)    Contact information:   [address] [place] [address] [phone]        BRING ALL MEDICATIONS WITH YOU TO FOLLOW UP APPOINTMENTS  Time spent with patient to include physician time: 49 min Signed: Theodore Demark 08/30/2012, 3:51 PM Co-Sign MD  Hillis Range, MD

## 2012-08-30 NOTE — Progress Notes (Signed)
Discharged to home with family office visits in place teaching done  

## 2012-12-27 ENCOUNTER — Encounter (HOSPITAL_COMMUNITY): Payer: Self-pay | Admitting: Emergency Medicine

## 2012-12-27 ENCOUNTER — Emergency Department (HOSPITAL_COMMUNITY): Payer: Self-pay

## 2012-12-27 ENCOUNTER — Inpatient Hospital Stay (HOSPITAL_COMMUNITY)
Admission: EM | Admit: 2012-12-27 | Discharge: 2013-01-04 | DRG: 419 | Disposition: A | Discharge status: Home / Self Care | Payer: MEDICAID | Attending: Cardiology | Admitting: Cardiology

## 2012-12-27 DIAGNOSIS — Z87891 Personal history of nicotine dependence: Secondary | ICD-10-CM

## 2012-12-27 DIAGNOSIS — R1011 Right upper quadrant pain: Secondary | ICD-10-CM

## 2012-12-27 DIAGNOSIS — E7800 Pure hypercholesterolemia, unspecified: Secondary | ICD-10-CM | POA: Diagnosis present

## 2012-12-27 DIAGNOSIS — M1990 Unspecified osteoarthritis, unspecified site: Secondary | ICD-10-CM | POA: Diagnosis present

## 2012-12-27 DIAGNOSIS — R079 Chest pain, unspecified: Secondary | ICD-10-CM

## 2012-12-27 DIAGNOSIS — I252 Old myocardial infarction: Secondary | ICD-10-CM

## 2012-12-27 DIAGNOSIS — R932 Abnormal findings on diagnostic imaging of liver and biliary tract: Secondary | ICD-10-CM

## 2012-12-27 DIAGNOSIS — E119 Type 2 diabetes mellitus without complications: Secondary | ICD-10-CM | POA: Diagnosis present

## 2012-12-27 DIAGNOSIS — K810 Acute cholecystitis: Secondary | ICD-10-CM

## 2012-12-27 DIAGNOSIS — I2510 Atherosclerotic heart disease of native coronary artery without angina pectoris: Secondary | ICD-10-CM | POA: Diagnosis present

## 2012-12-27 DIAGNOSIS — K219 Gastro-esophageal reflux disease without esophagitis: Secondary | ICD-10-CM | POA: Diagnosis present

## 2012-12-27 DIAGNOSIS — K8000 Calculus of gallbladder with acute cholecystitis without obstruction: Principal | ICD-10-CM | POA: Diagnosis present

## 2012-12-27 DIAGNOSIS — J449 Chronic obstructive pulmonary disease, unspecified: Secondary | ICD-10-CM | POA: Diagnosis present

## 2012-12-27 DIAGNOSIS — E876 Hypokalemia: Secondary | ICD-10-CM | POA: Diagnosis not present

## 2012-12-27 DIAGNOSIS — J4489 Other specified chronic obstructive pulmonary disease: Secondary | ICD-10-CM | POA: Diagnosis present

## 2012-12-27 HISTORY — DX: Unspecified osteoarthritis, unspecified site: M19.90

## 2012-12-27 HISTORY — DX: Personal history of other diseases of the digestive system: Z87.19

## 2012-12-27 HISTORY — DX: Other chronic pain: G89.29

## 2012-12-27 HISTORY — DX: Pain in unspecified hip: M25.559

## 2012-12-27 LAB — POCT I-STAT TROPONIN I: Troponin i, poc: 0 ng/mL (ref 0.00–0.08)

## 2012-12-27 LAB — CBC
HCT: 44.6 % (ref 36.0–46.0)
Hemoglobin: 15.5 g/dL — ABNORMAL HIGH (ref 12.0–15.0)
MCH: 31.7 pg (ref 26.0–34.0)
MCHC: 34.8 g/dL (ref 30.0–36.0)
MCV: 91.2 fL (ref 78.0–100.0)

## 2012-12-27 LAB — BASIC METABOLIC PANEL
BUN: 17 mg/dL (ref 6–23)
Creatinine, Ser: 0.57 mg/dL (ref 0.50–1.10)
GFR calc non Af Amer: >90 mL/min (ref >90)
Glucose, Bld: 127 mg/dL — ABNORMAL HIGH (ref 70–99)
Potassium: 3.9 mEq/L (ref 3.5–5.1)

## 2012-12-27 MED ORDER — MORPHINE SULFATE 4 MG/ML IJ SOLN
8.0000 mg | Freq: Once | INTRAMUSCULAR | Status: AC
  Administered 2012-12-27: 8 mg via INTRAVENOUS
  Filled 2012-12-27: qty 1

## 2012-12-27 MED ORDER — ONDANSETRON HCL 4 MG/2ML IJ SOLN
4.0000 mg | Freq: Once | INTRAMUSCULAR | Status: AC
  Administered 2012-12-27: 4 mg via INTRAVENOUS
  Filled 2012-12-27: qty 2

## 2012-12-27 MED ORDER — GI COCKTAIL ~~LOC~~
30.0000 mL | Freq: Once | ORAL | Status: AC
  Administered 2012-12-27: 30 mL via ORAL
  Filled 2012-12-27: qty 30

## 2012-12-27 MED ORDER — HEPARIN BOLUS VIA INFUSION
4000.0000 [IU] | Freq: Once | INTRAVENOUS | Status: AC
  Administered 2012-12-27: 4000 [IU] via INTRAVENOUS

## 2012-12-27 MED ORDER — HEPARIN (PORCINE) IN NACL 100-0.45 UNIT/ML-% IJ SOLN
1000.0000 [IU]/h | INTRAMUSCULAR | Status: DC
  Administered 2012-12-27: 1000 [IU]/h via INTRAVENOUS
  Filled 2012-12-27: qty 250

## 2012-12-27 MED ORDER — MORPHINE SULFATE 4 MG/ML IJ SOLN
INTRAMUSCULAR | Status: AC
  Filled 2012-12-27: qty 1

## 2012-12-27 MED ORDER — NITROGLYCERIN IN D5W 200-5 MCG/ML-% IV SOLN
5.0000 ug/min | INTRAVENOUS | Status: DC
  Administered 2012-12-27: 5 ug/min via INTRAVENOUS
  Filled 2012-12-27: qty 250

## 2012-12-27 NOTE — H&P (Signed)
[name] is an 62 y.o. female.   Chief Complaint: Recurrent retrosternal and epigastric pain HPI: Patient is 62 year old female with past medical history significant for coronary artery disease history of inferoposterior wall myocardial infarction in September of 2013 status post emergency PTCA stenting to occluded 100% RCA, history of V. fib arrest in the ER, hypertension, hypercholesteremia, chronic obstructive pulmonary disease, history of tobacco abuse, positive family history of coronary artery disease, came to the ER by EMS complaining of recurrent epigastric and retrosternal chest pain occasionally radiating to jaw and left side of neck and ear associated with nausea grade 7-8/10 off and on for last few weeks today the pain got worst so decided to call EMS. Patient denies any palpitation lightheadedness or syncope denies PND orthopnea or leg swelling. Denies any relation of chest pain to food breathing or movement. Patient states she had similar pain off and on in December and had stress test which was negative by her cardiologist in [facility] and subsequently had one admission here at [facility] ruled out for MI and subsequently was discharged to be followed with her primary cardiologist.  Past Medical History  Diagnosis Date  . Hypertension   . GERD (gastroesophageal reflux disease)   . Hypercholesteremia   . ST elevation myocardial infarction (STEMI) of inferoposterior wall September 2013    S/P DES X 3 RCA, EF 50%    Past Surgical History  Procedure Laterality Date  . Tonsillectomy    . Appendectomy    . Vulvectomy    . Breast surgery      No family history on file. Social History:  reports that she has been smoking.  She does not have any smokeless tobacco history on file. She reports that she does not drink alcohol. Her drug history is not on file.  Allergies:  Allergies  Allergen Reactions  . Ciprofloxacin Anaphylaxis  . Codeine Other (See Comments)    Unknown  reaction  . Erythromycin Nausea And Vomiting  . Penicillins Rash  . Sulfa Antibiotics Rash     (Not in a hospital admission)  Results for orders placed during the hospital encounter of 12/27/12 (from the past 48 hour(s))  CBC     Status: Abnormal   Collection Time    12/27/12  8:56 PM      Result Value Range   WBC 13.2 (*) 4.0 - 10.5 K/uL   RBC 4.89  3.87 - 5.11 MIL/uL   Hemoglobin 15.5 (*) 12.0 - 15.0 g/dL   HCT 29.5  62.1 - 30.8 %   MCV 91.2  78.0 - 100.0 fL   MCH 31.7  26.0 - 34.0 pg   MCHC 34.8  30.0 - 36.0 g/dL   RDW 65.7  84.6 - 96.2 %   Platelets 273  150 - 400 K/uL  BASIC METABOLIC PANEL     Status: Abnormal   Collection Time    12/27/12  8:56 PM      Result Value Range   Sodium 138  135 - 145 mEq/L   Potassium 3.9  3.5 - 5.1 mEq/L   Chloride 106  96 - 112 mEq/L   CO2 20  19 - 32 mEq/L   Glucose, Bld 127 (*) 70 - 99 mg/dL   BUN 17  6 - 23 mg/dL   Creatinine, Ser 9.52  0.50 - 1.10 mg/dL   Calcium 9.4  8.4 - 84.1 mg/dL   GFR calc non Af Amer >90  >90 mL/min   GFR calc  Af Amer >90  >90 mL/min   Comment:            The eGFR has been calculated     using the CKD EPI equation.     This calculation has not been     validated in all clinical     situations.     eGFR's persistently     <90 mL/min signify     possible Chronic Kidney Disease.  POCT I-STAT TROPONIN I     Status: None   Collection Time    12/27/12  9:14 PM      Result Value Range   Troponin i, poc 0.00  0.00 - 0.08 ng/mL   Comment 3            Comment: Due to the release kinetics of cTnI,     a negative result within the first hours     of the onset of symptoms does not rule out     myocardial infarction with certainty.     If myocardial infarction is still suspected,     repeat the test at appropriate intervals.   Dg Chest Portable 1 View  12/27/2012  *RADIOLOGY REPORT*  Clinical Data: Chest pain.  PORTABLE CHEST - 1 VIEW  Comparison: 08/29/2012  Findings: Lung volumes are relatively low.  No  edema, infiltrate or pleural fluid is seen.  Heart size is normal.  IMPRESSION: Low lung volumes.  No active disease.   Original Report Authenticated By: Irish Lack, M.D.     Review of Systems  Constitutional: Negative for fever and chills.  Eyes: Negative for double vision and photophobia.  Cardiovascular: Positive for chest pain. Negative for palpitations, orthopnea, claudication and leg swelling.  Gastrointestinal: Positive for nausea, abdominal pain and diarrhea. Negative for vomiting and constipation.  Genitourinary: Negative for dysuria.  Skin: Negative for rash.  Neurological: Negative for dizziness and headaches.    Blood pressure 172/86, pulse 63, temperature 98 F (36.7 C), temperature source Oral, resp. rate 21, SpO2 96.00%. Physical Exam  Constitutional: She is oriented to person, place, and time. She appears well-nourished.  HENT:  Head: Normocephalic and atraumatic.  Eyes: Conjunctivae and EOM are normal. Pupils are equal, round, and reactive to light. Left eye exhibits no discharge. No scleral icterus.  Neck: Normal range of motion. Neck supple. No tracheal deviation present. No thyromegaly present.  Cardiovascular: Normal rate and regular rhythm.   Murmur (Soft systolic murmur noted no S3 gallop) heard. Respiratory: Effort normal and breath sounds normal. No respiratory distress. She has no wheezes. She has no rales.  GI: Soft. Bowel sounds are normal. She exhibits no distension. There is no tenderness. There is no rebound.  Musculoskeletal: She exhibits no edema and no tenderness.  Neurological: She is alert and oriented to person, place, and time.     Assessment/Plan Recurrent chest pain rule out MI rule out restenosis Coronary artery disease history of and inferior posterior wall MI status post PCI 100% occluded RCA in September of 2013 Hypertension History of V. fib arrest in the past Hypercholesteremia COPD History of tobacco abuse Positive family history  of coronary artery disease Abdominal pain rule out GERD/peptic ulcer disease Degenerative joint disease Plan As per orders  Mallarie Voorhies N 12/27/2012, 11:15 PM

## 2012-12-27 NOTE — ED Notes (Signed)
Patient did not get relief from GI cocktail.  MD notified.

## 2012-12-27 NOTE — ED Notes (Signed)
Patient had previous MI May 24, 2012.

## 2012-12-27 NOTE — ED Provider Notes (Signed)
History     CSN: [id number]  Arrival date & time 12/27/12  2004   First MD Initiated Contact with Patient 12/27/12 2100      Chief Complaint  Patient presents with  . Chest Pain    (Consider location/radiation/quality/duration/timing/severity/associated sxs/prior treatment) Patient is a 62 y.o. female presenting with chest pain. The history is provided by the patient and medical records. No language interpreter was used.  Chest Pain Pain location:  Epigastric and substernal area (.Burning, pain in the epigastrium and lower, substernal area 33 hours ago.  She initially tried Prilosec and Zantac and Xanax without relief.) Pain quality: burning   Pain quality comment:  He subsequently took 4 baby aspirin and had a total of 3 nitroglycerin one of her home and to ambulance to 2 still feeling a burning pain.  She rates the pain of a similar 0.5.  Cervix spilled through Pain radiates to:  Does not radiate Pain radiates to the back: no   Pain severity:  Moderate (Pain rated at a 7 by pt.) Onset quality:  Sudden Duration:  4 hours Timing:  Constant Progression:  Waxing and waning Chronicity:  New Context comment:  No precipitating event. Relieved by:  Nothing Worsened by:  Nothing tried Ineffective treatments:  Aspirin and nitroglycerin (Prilosec, Zantac, Xanax.) Associated symptoms: abdominal pain, heartburn and nausea   Associated symptoms: no fever   Risk factors: coronary artery disease, high cholesterol, hypertension and smoking     Past Medical History  Diagnosis Date  . Hypertension   . GERD (gastroesophageal reflux disease)   . Hypercholesteremia   . ST elevation myocardial infarction (STEMI) of inferoposterior wall September 2013    S/P DES X 3 RCA, EF 50%    Past Surgical History  Procedure Laterality Date  . Tonsillectomy    . Appendectomy    . Vulvectomy    . Breast surgery      No family history on file.  History  Substance Use Topics  . Smoking status:  Current Every Day Smoker -- 0.50 packs/day  . Smokeless tobacco: Not on file  . Alcohol Use: No    OB History   Grav Para Term Preterm Abortions TAB SAB Ect Mult Living                  Review of Systems  Constitutional: Negative for fever and chills.  HENT: Negative.   Eyes: Negative.   Respiratory: Negative.   Cardiovascular: Positive for chest pain.  Gastrointestinal: Positive for heartburn, nausea and abdominal pain.  Genitourinary: Negative.   Musculoskeletal: Negative.   Skin: Negative.   Neurological: Negative.   Psychiatric/Behavioral: Negative.     Allergies  Ciprofloxacin; Codeine; Erythromycin; Penicillins; and Sulfa antibiotics  Home Medications   Current Outpatient Rx  Name  Route  Sig  Dispense  Refill  . acetaminophen (TYLENOL) 500 MG tablet   Oral   Take 500-1,000 mg by mouth every 4 (four) hours as needed for pain. For pain         . albuterol (PROVENTIL HFA;VENTOLIN HFA) 108 (90 BASE) MCG/ACT inhaler   Inhalation   Inhale 2 puffs into the lungs 2 (two) times daily as needed for wheezing or shortness of breath. For wheezing         . ALPRAZolam (XANAX) 0.5 MG tablet   Oral   Take 0.25 mg by mouth 2 (two) times daily.         Marland Kitchen aspirin EC 81 MG EC tablet  Oral   Take 1 tablet (81 mg total) by mouth daily.   30 tablet   3   . budesonide-formoterol (SYMBICORT) 160-4.5 MCG/ACT inhaler   Inhalation   Inhale 1 puff into the lungs daily.         Marland Kitchen lisinopril (PRINIVIL,ZESTRIL) 10 MG tablet   Oral   Take 10 mg by mouth every evening.         . metoprolol tartrate (LOPRESSOR) 25 MG tablet   Oral   Take 12.5 mg by mouth 2 (two) times daily.         . nitroGLYCERIN (NITROSTAT) 0.4 MG SL tablet   Sublingual   Place 1 tablet (0.4 mg total) under the tongue every 5 (five) minutes x 3 doses as needed for chest pain.   25 tablet   3   . omeprazole (PRILOSEC) 20 MG capsule   Oral   Take 20 mg by mouth 2 (two) times daily.          . pravastatin (PRAVACHOL) 40 MG tablet   Oral   Take 40 mg by mouth every evening.         . ranitidine (ZANTAC) 150 MG tablet   Oral   Take 150 mg by mouth 2 (two) times daily.         . Ticagrelor (BRILINTA) 90 MG TABS tablet   Oral   Take 1 tablet (90 mg total) by mouth 2 (two) times daily.   60 tablet   11     BP 172/86  Pulse 63  Temp(Src) 98 F (36.7 C) (Oral)  Resp 21  SpO2 96%  Physical Exam  Nursing note and vitals reviewed. Constitutional: She is oriented to person, place, and time. She appears well-developed and well-nourished.  In moderate distress with chest pain.  HENT:  Head: Normocephalic and atraumatic.  Right Ear: External ear normal.  Left Ear: External ear normal.  Mouth/Throat: Oropharynx is clear and moist.  Eyes: Conjunctivae and EOM are normal. Pupils are equal, round, and reactive to light.  Neck: Normal range of motion. Neck supple.  Cardiovascular: Normal rate, regular rhythm and normal heart sounds.   Pulmonary/Chest: Effort normal and breath sounds normal.  Abdominal: Soft. Bowel sounds are normal. She exhibits no distension. There is no tenderness.  Musculoskeletal: Normal range of motion. She exhibits no edema and no tenderness.  Neurological: She is alert and oriented to person, place, and time.  No sensory or motor deficit.  Skin: Skin is warm and dry.  Psychiatric: She has a normal mood and affect. Her behavior is normal.    ED Course  Procedures (including critical care time)  Labs Reviewed  CBC - Abnormal; Notable for the following:    WBC 13.2 (*)    Hemoglobin 15.5 (*)    All other components within normal limits  BASIC METABOLIC PANEL - Abnormal; Notable for the following:    Glucose, Bld 127 (*)    All other components within normal limits  POCT I-STAT TROPONIN I   Dg Chest Portable 1 View  12/27/2012  *RADIOLOGY REPORT*  Clinical Data: Chest pain.  PORTABLE CHEST - 1 VIEW  Comparison: 08/29/2012  Findings: Lung  volumes are relatively low.  No edema, infiltrate or pleural fluid is seen.  Heart size is normal.  IMPRESSION: Low lung volumes.  No active disease.   Original Report Authenticated By: Irish Lack, M.D.    Pt was seen and had physical examination.  She had not had success  with aspirin and NTG, and wanted to try a GI cocktail, which was ineffective.  Lab tests showed normal TNI, but she continued with chest pain.  Call to [facility], spoke to Dr. Terressa Koyanagi, who advised that they had taken pt as an unassigned pt in September 2013, that she had not followed up with them, and that she continued to be an unassigned pt.  I therefore contacted Dr. Sharyn Lull, on call for unassigned cardiology, who advised placing pt on IV heparin and IV NTG, and he came and admitted her.  1. Chest pain           Carleene Cooper III, MD 12/28/12 [phone]

## 2012-12-27 NOTE — ED Provider Notes (Signed)
8:21 PM  Date: 12/27/2012  Rate: 78  Rhythm: normal sinus rhythm and sinus arrhythmia  QRS Axis: left  Intervals: normal QRS:  Poor R wave progression in precordial leads suggests old anterior myocardial infarction.  ST/T Wave abnormalities: normal  Conduction Disutrbances:none  Narrative Interpretation: Borderline EKG  Old EKG Reviewed: none available    Carleene Cooper III, MD 12/27/12 2028

## 2012-12-27 NOTE — ED Notes (Signed)
Patient from home via [facility] c/o non radiating epigastric chest pain that started about two hours ago.  Patient has a hx of MI and had same pain with previous heart attack.  Patient received 2 nitro en route without any relief.   Patient also had 1 nitro and 324 of aspirin prior to EMS arrival.  Patient still having 8\10 chest pain.  Patient denies SOB, N/V.  Patient a&ox4 and stable.  Will continue to monitor.

## 2012-12-28 ENCOUNTER — Inpatient Hospital Stay (HOSPITAL_COMMUNITY): Payer: Self-pay

## 2012-12-28 ENCOUNTER — Encounter (HOSPITAL_COMMUNITY): Payer: Self-pay | Admitting: *Deleted

## 2012-12-28 LAB — URINALYSIS, ROUTINE W REFLEX MICROSCOPIC
Bilirubin Urine: NEGATIVE
Glucose, UA: NEGATIVE mg/dL
Ketones, ur: NEGATIVE mg/dL
Protein, ur: NEGATIVE mg/dL
pH: 6 (ref 5.0–8.0)

## 2012-12-28 LAB — CBC WITH DIFFERENTIAL/PLATELET
Eosinophils Relative: 0 % (ref 0–5)
Hemoglobin: 14.8 g/dL (ref 12.0–15.0)
Lymphocytes Relative: 8 % — ABNORMAL LOW (ref 12–46)
Lymphs Abs: 1.3 10*3/uL (ref 0.7–4.0)
MCV: 90.2 fL (ref 78.0–100.0)
Monocytes Relative: 2 % — ABNORMAL LOW (ref 3–12)
Neutrophils Relative %: 89 % — ABNORMAL HIGH (ref 43–77)
Platelets: 275 10*3/uL (ref 150–400)
RBC: 4.89 MIL/uL (ref 3.87–5.11)
WBC: 15.6 10*3/uL — ABNORMAL HIGH (ref 4.0–10.5)

## 2012-12-28 LAB — LIPID PANEL
LDL Cholesterol: 175 mg/dL — ABNORMAL HIGH (ref 0–99)
VLDL: 28 mg/dL (ref 0–40)

## 2012-12-28 LAB — URINE MICROSCOPIC-ADD ON

## 2012-12-28 LAB — MRSA PCR SCREENING: MRSA by PCR: NEGATIVE

## 2012-12-28 LAB — COMPREHENSIVE METABOLIC PANEL
ALT: 19 U/L (ref 0–35)
AST: 20 U/L (ref 0–37)
CO2: 20 mEq/L (ref 19–32)
Chloride: 106 mEq/L (ref 96–112)
GFR calc non Af Amer: >90 mL/min (ref >90)
Sodium: 140 mEq/L (ref 135–145)
Total Bilirubin: 0.2 mg/dL — ABNORMAL LOW (ref 0.3–1.2)

## 2012-12-28 LAB — HEPARIN LEVEL (UNFRACTIONATED): Heparin Unfractionated: 0.35 IU/mL (ref 0.30–0.70)

## 2012-12-28 LAB — CBC
Platelets: 267 10*3/uL (ref 150–400)
RBC: 4.93 MIL/uL (ref 3.87–5.11)
WBC: 22.1 10*3/uL — ABNORMAL HIGH (ref 4.0–10.5)

## 2012-12-28 LAB — TSH: TSH: 0.683 u[IU]/mL (ref 0.350–4.500)

## 2012-12-28 LAB — PROTIME-INR: Prothrombin Time: 12.2 seconds (ref 11.6–15.2)

## 2012-12-28 MED ORDER — LISINOPRIL 10 MG PO TABS
10.0000 mg | ORAL_TABLET | Freq: Every evening | ORAL | Status: DC
  Administered 2012-12-28: 10 mg via ORAL
  Filled 2012-12-28 (×2): qty 1

## 2012-12-28 MED ORDER — ASPIRIN EC 81 MG PO TBEC: 81.0000 mg | DELAYED_RELEASE_TABLET | Freq: Every day | ORAL | Status: DC

## 2012-12-28 MED ORDER — HEPARIN (PORCINE) IN NACL 100-0.45 UNIT/ML-% IJ SOLN
1250.0000 [IU]/h | INTRAMUSCULAR | Status: DC
  Administered 2012-12-28: 1250 [IU]/h via INTRAVENOUS
  Administered 2012-12-28: 1000 [IU]/h via INTRAVENOUS
  Filled 2012-12-28 (×3): qty 250

## 2012-12-28 MED ORDER — ONDANSETRON HCL 4 MG/2ML IJ SOLN
4.0000 mg | Freq: Four times a day (QID) | INTRAMUSCULAR | Status: DC | PRN
  Administered 2012-12-29 – 2013-01-01 (×5): 4 mg via INTRAVENOUS
  Filled 2012-12-28 (×5): qty 2

## 2012-12-28 MED ORDER — DEXTROSE 5 % IV SOLN
500.0000 mg | INTRAVENOUS | Status: DC
  Administered 2012-12-28: 500 mg via INTRAVENOUS
  Filled 2012-12-28 (×2): qty 500

## 2012-12-28 MED ORDER — REGADENOSON 0.4 MG/5ML IV SOLN
0.4000 mg | Freq: Once | INTRAVENOUS | Status: AC
  Administered 2012-12-28: 0.4 mg via INTRAVENOUS
  Filled 2012-12-28: qty 5

## 2012-12-28 MED ORDER — METOPROLOL TARTRATE 12.5 MG HALF TABLET
12.5000 mg | ORAL_TABLET | Freq: Two times a day (BID) | ORAL | Status: DC
  Administered 2012-12-28 – 2012-12-29 (×3): 12.5 mg via ORAL
  Filled 2012-12-28 (×4): qty 1

## 2012-12-28 MED ORDER — TECHNETIUM TC 99M SESTAMIBI GENERIC - CARDIOLITE
10.0000 | Freq: Once | INTRAVENOUS | Status: AC | PRN
  Administered 2012-12-28: 10 via INTRAVENOUS

## 2012-12-28 MED ORDER — ALUM & MAG HYDROXIDE-SIMETH 200-200-20 MG/5ML PO SUSP
30.0000 mL | Freq: Four times a day (QID) | ORAL | Status: DC | PRN
  Administered 2012-12-28 – 2012-12-31 (×2): 30 mL via ORAL
  Filled 2012-12-28 (×2): qty 30

## 2012-12-28 MED ORDER — ASPIRIN EC 81 MG PO TBEC
81.0000 mg | DELAYED_RELEASE_TABLET | Freq: Every day | ORAL | Status: DC
  Administered 2012-12-28 – 2013-01-04 (×7): 81 mg via ORAL
  Filled 2012-12-28 (×8): qty 1

## 2012-12-28 MED ORDER — DEXTROSE 5 % IV SOLN
1.0000 g | INTRAVENOUS | Status: DC
  Administered 2012-12-28: 1 g via INTRAVENOUS
  Filled 2012-12-28 (×2): qty 10

## 2012-12-28 MED ORDER — NITROGLYCERIN 0.4 MG SL SUBL: 0.4000 mg | SUBLINGUAL_TABLET | SUBLINGUAL | Status: DC | PRN

## 2012-12-28 MED ORDER — ALBUTEROL SULFATE HFA 108 (90 BASE) MCG/ACT IN AERS
2.0000 | INHALATION_SPRAY | Freq: Two times a day (BID) | RESPIRATORY_TRACT | Status: DC | PRN
  Administered 2013-01-02 – 2013-01-03 (×2): 2 via RESPIRATORY_TRACT
  Filled 2012-12-28: qty 6.7

## 2012-12-28 MED ORDER — HEPARIN SODIUM (PORCINE) 5000 UNIT/ML IJ SOLN
5000.0000 [IU] | Freq: Three times a day (TID) | INTRAMUSCULAR | Status: DC
  Administered 2012-12-28 – 2013-01-04 (×18): 5000 [IU] via SUBCUTANEOUS
  Filled 2012-12-28 (×23): qty 1

## 2012-12-28 MED ORDER — ACETAMINOPHEN 325 MG PO TABS
650.0000 mg | ORAL_TABLET | ORAL | Status: DC | PRN
  Administered 2012-12-28 – 2013-01-04 (×14): 650 mg via ORAL
  Filled 2012-12-28 (×5): qty 2
  Filled 2012-12-28: qty 1
  Filled 2012-12-28 (×7): qty 2

## 2012-12-28 MED ORDER — PANTOPRAZOLE SODIUM 40 MG IV SOLR
40.0000 mg | INTRAVENOUS | Status: DC
  Administered 2012-12-30 – 2013-01-02 (×4): 40 mg via INTRAVENOUS
  Filled 2012-12-28 (×6): qty 40

## 2012-12-28 MED ORDER — ATORVASTATIN CALCIUM 80 MG PO TABS
80.0000 mg | ORAL_TABLET | Freq: Every day | ORAL | Status: DC
  Administered 2012-12-29 – 2013-01-03 (×6): 80 mg via ORAL
  Filled 2012-12-28 (×8): qty 1

## 2012-12-28 MED ORDER — MORPHINE SULFATE 2 MG/ML IJ SOLN
2.0000 mg | INTRAMUSCULAR | Status: AC
  Administered 2012-12-28: 2 mg via INTRAVENOUS
  Filled 2012-12-28: qty 1

## 2012-12-28 MED ORDER — ALPRAZOLAM 0.25 MG PO TABS
0.2500 mg | ORAL_TABLET | Freq: Two times a day (BID) | ORAL | Status: DC
  Administered 2012-12-28 – 2013-01-04 (×14): 0.25 mg via ORAL
  Filled 2012-12-28 (×14): qty 1

## 2012-12-28 MED ORDER — BUDESONIDE-FORMOTEROL FUMARATE 160-4.5 MCG/ACT IN AERO
1.0000 | INHALATION_SPRAY | Freq: Every day | RESPIRATORY_TRACT | Status: DC
  Administered 2012-12-28 – 2013-01-04 (×9): 1 via RESPIRATORY_TRACT
  Filled 2012-12-28: qty 6

## 2012-12-28 MED ORDER — TECHNETIUM TC 99M SESTAMIBI GENERIC - CARDIOLITE
30.0000 | Freq: Once | INTRAVENOUS | Status: AC | PRN
  Administered 2012-12-28: 30 via INTRAVENOUS

## 2012-12-28 MED ORDER — ACETAMINOPHEN 325 MG PO TABS
ORAL_TABLET | ORAL | Status: AC
  Filled 2012-12-28: qty 2

## 2012-12-28 MED ORDER — NITROGLYCERIN IN D5W 200-5 MCG/ML-% IV SOLN: 5.0000 ug/min | INTRAVENOUS | Status: DC

## 2012-12-28 MED ORDER — MORPHINE SULFATE 2 MG/ML IJ SOLN
2.0000 mg | INTRAMUSCULAR | Status: DC | PRN
  Administered 2012-12-28 – 2012-12-29 (×2): 2 mg via INTRAVENOUS
  Filled 2012-12-28 (×2): qty 1

## 2012-12-28 MED ORDER — DEXTROSE 5 % IV SOLN
1.0000 g | Freq: Two times a day (BID) | INTRAVENOUS | Status: DC
  Administered 2012-12-28 – 2013-01-04 (×14): 1 g via INTRAVENOUS
  Filled 2012-12-28 (×15): qty 10

## 2012-12-28 MED ORDER — TICAGRELOR 90 MG PO TABS
90.0000 mg | ORAL_TABLET | Freq: Two times a day (BID) | ORAL | Status: DC
  Administered 2012-12-28 – 2012-12-29 (×3): 90 mg via ORAL
  Filled 2012-12-28 (×4): qty 1

## 2012-12-28 NOTE — Progress Notes (Signed)
Order to stop Zithromax/MD(Harwani)

## 2012-12-28 NOTE — Progress Notes (Signed)
Pt c/o IV site hurting while getting IV Zithromax says she allergic to a lot of things . Stopped med and IV site was red and a bruise at site not sure if it was med or not/ left antecubital will continue to monitor and inform MD.

## 2012-12-28 NOTE — Consult Note (Signed)
Reason for Consult: Epigastric/Chest pain and elevated WBC. Referring Physician: Rinaldo Cloud, M.D.  [name] HPI: This is a 62 year old female with multiple medical problems admitted for chest pain with radiation to her neck and left arm.  She was admitted to the Cardiology service and work up was negative for a cardiac source.  Further work up also revealed an elevated WBC.  The patient has a history of this type of pain in the past with a negative work up with an ultrasound and CT scan.  The ultrasound did reveal a small gallbladder polyp in 2009, but no stones or cholecystitis.  Over thea past several weeks she reports intermittent burning in her chest.  She is not able to reports any consistent pattern, i.e., after PO intake.  However, her pain progressed to a point that she required further evaluation in the ER.  She reports having issues with nausea and vomiting with the burning sensation.  In the past she presented this way with her MI, but the current nuclear med stress test is negative for any reversible ischemia.  It is consistent with a prior infarct and her EF is at 56%.  Past Medical History  Diagnosis Date  . Hypertension   . GERD (gastroesophageal reflux disease)   . Hypercholesteremia   . ST elevation myocardial infarction (STEMI) of inferoposterior wall September 2013    S/P DES X 3 RCA, EF 50%  . Chronic hip pain   . Arthritis   . H/O hiatal hernia     Past Surgical History  Procedure Laterality Date  . Tonsillectomy    . Appendectomy    . Vulvectomy    . Breast surgery      No family history on file.  Social History:  reports that she has been smoking.  She does not have any smokeless tobacco history on file. She reports that she does not drink alcohol or use illicit drugs.  Allergies:  Allergies  Allergen Reactions  . Ciprofloxacin Anaphylaxis  . Codeine Other (See Comments)    Unknown reaction  . Erythromycin Nausea And Vomiting  . Penicillins Rash   . Sulfa Antibiotics Rash    Medications:  Scheduled: . ALPRAZolam  0.25 mg Oral BID  . aspirin EC  81 mg Oral Daily  . atorvastatin  80 mg Oral q1800  . azithromycin  500 mg Intravenous Q24H  . budesonide-formoterol  1 puff Inhalation Daily  . cefTRIAXone (ROCEPHIN)  IV  1 g Intravenous Q24H  . lisinopril  10 mg Oral QPM  . metoprolol tartrate  12.5 mg Oral BID  . pantoprazole (PROTONIX) IV  40 mg Intravenous Q24H  . Ticagrelor  90 mg Oral BID   Continuous: . heparin 1,000 Units/hr (12/28/12 0148)  . nitroGLYCERIN 10 mcg/min (12/28/12 0754)    Results for orders placed during the hospital encounter of 12/27/12 (from the past 24 hour(s))  CBC     Status: Abnormal   Collection Time    12/27/12  8:56 PM      Result Value Range   WBC 13.2 (*) 4.0 - 10.5 K/uL   RBC 4.89  3.87 - 5.11 MIL/uL   Hemoglobin 15.5 (*) 12.0 - 15.0 g/dL   HCT 16.1  09.6 - 04.5 %   MCV 91.2  78.0 - 100.0 fL   MCH 31.7  26.0 - 34.0 pg   MCHC 34.8  30.0 - 36.0 g/dL   RDW 40.9  81.1 - 91.4 %   Platelets  273  150 - 400 K/uL  BASIC METABOLIC PANEL     Status: Abnormal   Collection Time    12/27/12  8:56 PM      Result Value Range   Sodium 138  135 - 145 mEq/L   Potassium 3.9  3.5 - 5.1 mEq/L   Chloride 106  96 - 112 mEq/L   CO2 20  19 - 32 mEq/L   Glucose, Bld 127 (*) 70 - 99 mg/dL   BUN 17  6 - 23 mg/dL   Creatinine, Ser 1.61  0.50 - 1.10 mg/dL   Calcium 9.4  8.4 - 09.6 mg/dL   GFR calc non Af Amer >90  >90 mL/min   GFR calc Af Amer >90  >90 mL/min  POCT I-STAT TROPONIN I     Status: None   Collection Time    12/27/12  9:14 PM      Result Value Range   Troponin i, poc 0.00  0.00 - 0.08 ng/mL   Comment 3           MRSA PCR SCREENING     Status: None   Collection Time    12/28/12  3:02 AM      Result Value Range   MRSA by PCR NEGATIVE  NEGATIVE  TROPONIN I     Status: None   Collection Time    12/28/12  3:10 AM      Result Value Range   Troponin I <0.30  <0.30 ng/mL  PROTIME-INR      Status: None   Collection Time    12/28/12  3:10 AM      Result Value Range   Prothrombin Time 12.2  11.6 - 15.2 seconds   INR 0.91  0.00 - 1.49  APTT     Status: Abnormal   Collection Time    12/28/12  3:10 AM      Result Value Range   aPTT 73 (*) 24 - 37 seconds  CBC WITH DIFFERENTIAL     Status: Abnormal   Collection Time    12/28/12  3:10 AM      Result Value Range   WBC 15.6 (*) 4.0 - 10.5 K/uL   RBC 4.89  3.87 - 5.11 MIL/uL   Hemoglobin 14.8  12.0 - 15.0 g/dL   HCT 04.5  40.9 - 81.1 %   MCV 90.2  78.0 - 100.0 fL   MCH 30.3  26.0 - 34.0 pg   MCHC 33.6  30.0 - 36.0 g/dL   RDW 91.4  78.2 - 95.6 %   Platelets 275  150 - 400 K/uL   Neutrophils Relative 89 (*) 43 - 77 %   Neutro Abs 13.9 (*) 1.7 - 7.7 K/uL   Lymphocytes Relative 8 (*) 12 - 46 %   Lymphs Abs 1.3  0.7 - 4.0 K/uL   Monocytes Relative 2 (*) 3 - 12 %   Monocytes Absolute 0.4  0.1 - 1.0 K/uL   Eosinophils Relative 0  0 - 5 %   Eosinophils Absolute 0.0  0.0 - 0.7 K/uL   Basophils Relative 0  0 - 1 %   Basophils Absolute 0.0  0.0 - 0.1 K/uL  TSH     Status: None   Collection Time    12/28/12  3:10 AM      Result Value Range   TSH 0.683  0.350 - 4.500 uIU/mL  COMPREHENSIVE METABOLIC PANEL     Status: Abnormal   Collection Time  12/28/12  3:10 AM      Result Value Range   Sodium 140  135 - 145 mEq/L   Potassium 4.3  3.5 - 5.1 mEq/L   Chloride 106  96 - 112 mEq/L   CO2 20  19 - 32 mEq/L   Glucose, Bld 157 (*) 70 - 99 mg/dL   BUN 16  6 - 23 mg/dL   Creatinine, Ser 1.61  0.50 - 1.10 mg/dL   Calcium 9.5  8.4 - 09.6 mg/dL   Total Protein 7.4  6.0 - 8.3 g/dL   Albumin 4.0  3.5 - 5.2 g/dL   AST 20  0 - 37 U/L   ALT 19  0 - 35 U/L   Alkaline Phosphatase 148 (*) 39 - 117 U/L   Total Bilirubin 0.2 (*) 0.3 - 1.2 mg/dL   GFR calc non Af Amer >90  >90 mL/min   GFR calc Af Amer >90  >90 mL/min  MAGNESIUM     Status: None   Collection Time    12/28/12  3:10 AM      Result Value Range   Magnesium 2.0  1.5 - 2.5  mg/dL  LIPID PANEL     Status: Abnormal   Collection Time    12/28/12  3:10 AM      Result Value Range   Cholesterol 257 (*) 0 - 200 mg/dL   Triglycerides 045  <409 mg/dL   HDL 54  >81 mg/dL   Total CHOL/HDL Ratio 4.8     VLDL 28  0 - 40 mg/dL   LDL Cholesterol 191 (*) 0 - 99 mg/dL  HEPARIN LEVEL (UNFRACTIONATED)     Status: None   Collection Time    12/28/12  7:10 AM      Result Value Range   Heparin Unfractionated 0.35  0.30 - 0.70 IU/mL  CBC     Status: Abnormal   Collection Time    12/28/12  7:10 AM      Result Value Range   WBC 22.1 (*) 4.0 - 10.5 K/uL   RBC 4.93  3.87 - 5.11 MIL/uL   Hemoglobin 15.7 (*) 12.0 - 15.0 g/dL   HCT 47.8  29.5 - 62.1 %   MCV 90.7  78.0 - 100.0 fL   MCH 31.8  26.0 - 34.0 pg   MCHC 35.1  30.0 - 36.0 g/dL   RDW 30.8  65.7 - 84.6 %   Platelets 267  150 - 400 K/uL  TROPONIN I     Status: None   Collection Time    12/28/12 12:05 PM      Result Value Range   Troponin I <0.30  <0.30 ng/mL  URINALYSIS, ROUTINE W REFLEX MICROSCOPIC     Status: Abnormal   Collection Time    12/28/12  1:05 PM      Result Value Range   Color, Urine YELLOW  YELLOW   APPearance CLEAR  CLEAR   Specific Gravity, Urine 1.007  1.005 - 1.030   pH 6.0  5.0 - 8.0   Glucose, UA NEGATIVE  NEGATIVE mg/dL   Hgb urine dipstick SMALL (*) NEGATIVE   Bilirubin Urine NEGATIVE  NEGATIVE   Ketones, ur NEGATIVE  NEGATIVE mg/dL   Protein, ur NEGATIVE  NEGATIVE mg/dL   Urobilinogen, UA 0.2  0.0 - 1.0 mg/dL   Nitrite NEGATIVE  NEGATIVE   Leukocytes, UA NEGATIVE  NEGATIVE  URINE MICROSCOPIC-ADD ON     Status: None   Collection Time  12/28/12  1:05 PM      Result Value Range   Squamous Epithelial / LPF RARE  RARE   WBC, UA 0-2  <3 WBC/hpf   RBC / HPF 3-6  <3 RBC/hpf   Bacteria, UA RARE  RARE   Urine-Other RARE YEAST       Dg Chest Portable 1 View  12/27/2012  *RADIOLOGY REPORT*  Clinical Data: Chest pain.  PORTABLE CHEST - 1 VIEW  Comparison: 08/29/2012  Findings: Lung  volumes are relatively low.  No edema, infiltrate or pleural fluid is seen.  Heart size is normal.  IMPRESSION: Low lung volumes.  No active disease.   Original Report Authenticated By: Irish Lack, M.D.    Dg Abd 2 Views  12/28/2012  *RADIOLOGY REPORT*  Clinical Data: Abdominal pain and nausea.  ABDOMEN - 2 VIEW  Comparison: None.  Findings: The bowel gas pattern is normal.  No evidence of bowel obstruction or significant ileus.  No free air is identified.  No abnormal calcifications are seen.  Bony structures show degenerative changes and leftward convex scoliosis of the lumbar spine.  IMPRESSION: Normal bowel gas pattern.   Original Report Authenticated By: Irish Lack, M.D.     ROS:  As stated above in the HPI otherwise negative.  Blood pressure 119/76, pulse 106, temperature 98.2 F (36.8 C), temperature source Oral, resp. rate 21, height 5\' 5"  (1.651 m), weight 184 lb 4.9 oz (83.6 kg), SpO2 96.00%.    PE: Gen: NAD, Alert and Oriented HEENT:  Crisp/AT, EOMI Neck: Supple, no LAD Lungs: CTA Bilaterally CV: RRR without M/G/R ABM: Soft, tender in the RUQ, +BS Ext: No C/C/E  Assessment/Plan: 1) RUQ pain. 2) Leukocytosis. 3) Personal history of CAD/MI   With her current clinical presentation, elevated WBC, and negative cardiac work up I am highly suspicious for acute cholecystitis.  Her RUQ U/S is pending at this time.  She is on Ceftriaxone 1 gram QD, but I think she will benefit with Q12 hour dosing.  Plan: 1) Await RUQ U/S. 2) Continue with antibiotics.  Andris Brothers D 12/28/2012, 2:28 PM

## 2012-12-28 NOTE — Progress Notes (Signed)
ANTICOAGULATION CONSULT NOTE - Initial Consult  Pharmacy Consult for heparin Indication: chest pain/ACS  Allergies  Allergen Reactions  . Ciprofloxacin Anaphylaxis  . Codeine Other (See Comments)    Unknown reaction  . Erythromycin Nausea And Vomiting  . Penicillins Rash  . Sulfa Antibiotics Rash    Patient Measurements: Heparin Dosing Weight: 75kg  Vital Signs: Temp: 98 F (36.7 C) (04/24 2014) Temp src: Oral (04/24 2014) BP: 172/86 mmHg (04/24 2245) Pulse Rate: 63 (04/24 2245)  Labs:  Recent Labs  12/27/12 2056  HGB 15.5*  HCT 44.6  PLT 273  CREATININE 0.57    Medical History: Past Medical History  Diagnosis Date  . Hypertension   . GERD (gastroesophageal reflux disease)   . Hypercholesteremia   . ST elevation myocardial infarction (STEMI) of inferoposterior wall September 2013    S/P DES X 3 RCA, EF 50%    Assessment: 62yo female c/o recurrent retrosternal and epigastric pain, concern for restenosis vs new MI, initial troponin negative, to begin heparin.  Goal of Therapy:  Heparin level 0.3-0.7 units/ml Monitor platelets by anticoagulation protocol: Yes   Plan:  EDMD gave heparin 4000 units IV bolus x1 followed by gtt at 1000 units/hr; will continue at this rate for now and monitor heparin levels and CBC.  Vernard Gambles, PharmD, BCPS  12/28/2012,1:11 AM

## 2012-12-28 NOTE — Progress Notes (Signed)
Subjective:  Patient complains of vague epigastric pain. Denies any chest pain. Denies nausea or vomiting. Denies cough or urinary complaints. Denies fever chills. White count markedly elevated   Objective:  Vital Signs in the last 24 hours: Temp:  [97.6 F (36.4 C)-98 F (36.7 C)] 97.9 F (36.6 C) (04/25 0723) Pulse Rate:  [53-119] 119 (04/25 0723) Resp:  [13-23] 16 (04/25 0723) BP: (133-172)/(7-86) 133/79 mmHg (04/25 0723) SpO2:  [92 %-100 %] 95 % (04/25 0723) Weight:  [83.6 kg (184 lb 4.9 oz)] 83.6 kg (184 lb 4.9 oz) (04/25 0305)  Intake/Output from previous day: 04/24 0701 - 04/25 0700 In: -  Out: 500 [Urine:500] Intake/Output from this shift: Total I/O In: -  Out: 300 [Urine:300]  Physical Exam: Neck: no adenopathy, no carotid bruit, no JVD and supple, symmetrical, trachea midline Lungs: clear to auscultation bilaterally Heart: regular rate and rhythm, S1, S2 normal, no murmur, click, rub or gallop Abdomen: soft, non-tender; bowel sounds normal; no masses,  no organomegaly  Lab Results:  Recent Labs  12/28/12 0310 12/28/12 0710  WBC 15.6* 22.1*  HGB 14.8 15.7*  PLT 275 267    Recent Labs  12/27/12 2056 12/28/12 0310  NA 138 140  K 3.9 4.3  CL 106 106  CO2 20 20  GLUCOSE 127* 157*  BUN 17 16  CREATININE 0.57 0.56    Recent Labs  12/28/12 0310  TROPONINI <0.30   Hepatic Function Panel  Recent Labs  12/28/12 0310  PROT 7.4  ALBUMIN 4.0  AST 20  ALT 19  ALKPHOS 148*  BILITOT 0.2*    Recent Labs  12/28/12 0310  CHOL 257*   No results found for this basename: PROTIME,  in the last 72 hours  Imaging: Imaging results have been reviewed and Dg Chest Portable 1 View  12/27/2012  *RADIOLOGY REPORT*  Clinical Data: Chest pain.  PORTABLE CHEST - 1 VIEW  Comparison: 08/29/2012  Findings: Lung volumes are relatively low.  No edema, infiltrate or pleural fluid is seen.  Heart size is normal.  IMPRESSION: Low lung volumes.  No active disease.    Original Report Authenticated By: Irish Lack, M.D.     Cardiac Studies:  Assessment/Plan:  Status post Recurrent chest pain  MI rule out, rule out restenosis  Coronary artery disease history of and inferior posterior wall MI status post PCI 100% occluded RCA in September of 2013  Hypertension  History of V. fib arrest in the past  Hypercholesteremia  COPD  History of tobacco abuse  Positive family history of coronary artery disease  Abdominal pain rule out GERD/peptic ulcer disease  Degenerative joint disease  Marked leukocytosis Plan Schedule for Lasix and Myoview today We'll get GI consult Check cultures We'll start empiric antibiotics in view of marked leukocytosis pending cultures  LOS: 1 day    Milanya Sunderland N 12/28/2012, 7:46 AM

## 2012-12-28 NOTE — Progress Notes (Signed)
ANTICOAGULATION CONSULT NOTE - follow up consult  Pharmacy Consult for heparin Indication: chest pain/ACS  Allergies  Allergen Reactions  . Ciprofloxacin Anaphylaxis  . Codeine Other (See Comments)    Unknown reaction  . Erythromycin Nausea And Vomiting  . Penicillins Rash  . Sulfa Antibiotics Rash    Patient Measurements: Heparin Dosing Weight: 75kg  Vital Signs: Temp: 97.9 F (36.6 C) (04/25 0723) Temp src: Oral (04/25 0723) BP: 133/79 mmHg (04/25 0723) Pulse Rate: 119 (04/25 0723)  Labs:  Recent Labs  12/27/12 2056 12/28/12 0310 12/28/12 0710  HGB 15.5* 14.8 15.7*  HCT 44.6 44.1 44.7  PLT 273 275 267  APTT  --  73*  --   LABPROT  --  12.2  --   INR  --  0.91  --   HEPARINUNFRC  --   --  0.35  CREATININE 0.57 0.56  --   TROPONINI  --  <0.30  --     Assessment: 62yo female c/o recurrent retrosternal and epigastric pain, concern for restenosis vs new MI.  First heparin level therapeutic at 0.35.  CBC is stable.  Goal of Therapy:  Heparin level 0.3-0.7 units/ml Monitor platelets by anticoagulation protocol: Yes   Plan:   Increase heparin to 1100 units/hr to keep heparin level in therapeutic range.    Heparin level in 6 hours to confirm therapeutic dosing.   Celedonio Miyamoto, PharmD, BCPS Clinical Pharmacist Pager [phone]   12/28/2012,8:18 AM

## 2012-12-28 NOTE — Progress Notes (Signed)
Utilization Review Completed. 12/28/2012  

## 2012-12-28 NOTE — Progress Notes (Signed)
ANTICOAGULATION CONSULT NOTE - Follow Up Consult  Pharmacy Consult for Heparin Indication: chest pain/ACS  Allergies  Allergen Reactions  . Ciprofloxacin Anaphylaxis  . Codeine Other (See Comments)    Unknown reaction  . Erythromycin Nausea And Vomiting  . Penicillins Rash  . Sulfa Antibiotics Rash    Patient Measurements: Height: 5\' 5"  (165.1 cm) Weight: 184 lb 4.9 oz (83.6 kg) IBW/kg (Calculated) : 57 Heparin Dosing Weight: 75 kg  Vital Signs: Temp: 98.2 F (36.8 C) (04/25 1234) Temp src: Oral (04/25 1234) BP: 119/76 mmHg (04/25 1234) Pulse Rate: 106 (04/25 1234)  Labs:  Recent Labs  12/27/12 2056 12/28/12 0310 12/28/12 0710 12/28/12 1205 12/28/12 1518  HGB 15.5* 14.8 15.7*  --   --   HCT 44.6 44.1 44.7  --   --   PLT 273 275 267  --   --   APTT  --  73*  --   --   --   LABPROT  --  12.2  --   --   --   INR  --  0.91  --   --   --   HEPARINUNFRC  --   --  0.35  --  0.23*  CREATININE 0.57 0.56  --   --   --   TROPONINI  --  <0.30  --  <0.30  --     Estimated Creatinine Clearance: 76.8 ml/min (by C-G formula based on Cr of 0.56).   Medications:  Heparin @ 1100 units/hr (11 ml/hr)  Assessment: 62 y.o. F who continues on heparin for r/o ACS. Patient was scheduled to go for [facility] today. Heparin level this afternoon is slightly SUBtherapeutic (HL 0.23, goal of 0.3-0.7). Hgb/Hct/Plt okay. No overt s/sx of bleeding noted.   Goal of Therapy:  Heparin level 0.3-0.7 units/ml Monitor platelets by anticoagulation protocol: Yes   Plan:  1. Increase heparin drip rate to 1250 units/hr (12.5 ml/hr) 2. Will continue to monitor for any signs/symptoms of bleeding and will follow up with heparin level in 6 hours   Georgina Pillion, PharmD, BCPS Clinical Pharmacist Pager: [phone] 12/28/2012 4:28 PM

## 2012-12-29 ENCOUNTER — Encounter (HOSPITAL_COMMUNITY): Payer: Self-pay | Admitting: *Deleted

## 2012-12-29 ENCOUNTER — Inpatient Hospital Stay (HOSPITAL_COMMUNITY): Payer: Self-pay

## 2012-12-29 DIAGNOSIS — K810 Acute cholecystitis: Secondary | ICD-10-CM

## 2012-12-29 DIAGNOSIS — R1011 Right upper quadrant pain: Secondary | ICD-10-CM

## 2012-12-29 DIAGNOSIS — R932 Abnormal findings on diagnostic imaging of liver and biliary tract: Secondary | ICD-10-CM

## 2012-12-29 DIAGNOSIS — R079 Chest pain, unspecified: Secondary | ICD-10-CM

## 2012-12-29 DIAGNOSIS — K8066 Calculus of gallbladder and bile duct with acute and chronic cholecystitis without obstruction: Secondary | ICD-10-CM

## 2012-12-29 LAB — BASIC METABOLIC PANEL
CO2: 26 mEq/L (ref 19–32)
Calcium: 9.8 mg/dL (ref 8.4–10.5)
Chloride: 100 mEq/L (ref 96–112)
Creatinine, Ser: 0.66 mg/dL (ref 0.50–1.10)
GFR calc Af Amer: >90 mL/min (ref >90)
Sodium: 137 mEq/L (ref 135–145)

## 2012-12-29 LAB — URINE CULTURE: Colony Count: NO GROWTH

## 2012-12-29 LAB — CBC
Platelets: 255 10*3/uL (ref 150–400)
RBC: 4.96 MIL/uL (ref 3.87–5.11)
RDW: 14.6 % (ref 11.5–15.5)
WBC: 21.9 10*3/uL — ABNORMAL HIGH (ref 4.0–10.5)

## 2012-12-29 LAB — LIPASE, BLOOD: Lipase: 14 U/L (ref 11–59)

## 2012-12-29 MED ORDER — HYDROMORPHONE HCL PF 1 MG/ML IJ SOLN
1.0000 mg | INTRAMUSCULAR | Status: DC | PRN
  Administered 2012-12-29 – 2012-12-31 (×9): 1 mg via INTRAVENOUS
  Filled 2012-12-29 (×9): qty 1

## 2012-12-29 MED ORDER — IOHEXOL 300 MG/ML  SOLN
25.0000 mL | INTRAMUSCULAR | Status: AC
  Administered 2012-12-29 (×2): 25 mL via ORAL

## 2012-12-29 MED ORDER — METOPROLOL TARTRATE 25 MG PO TABS
25.0000 mg | ORAL_TABLET | Freq: Two times a day (BID) | ORAL | Status: DC
  Administered 2012-12-30 – 2013-01-04 (×9): 25 mg via ORAL
  Filled 2012-12-29 (×14): qty 1

## 2012-12-29 MED ORDER — SODIUM CHLORIDE 0.9 % IV BOLUS (SEPSIS)
500.0000 mL | Freq: Once | INTRAVENOUS | Status: AC
  Administered 2012-12-29: 500 mL via INTRAVENOUS

## 2012-12-29 MED ORDER — METRONIDAZOLE IN NACL 5-0.79 MG/ML-% IV SOLN
500.0000 mg | Freq: Four times a day (QID) | INTRAVENOUS | Status: DC
  Administered 2012-12-29 – 2012-12-31 (×6): 500 mg via INTRAVENOUS
  Administered 2012-12-31: .5 g via INTRAVENOUS
  Administered 2012-12-31 – 2013-01-04 (×17): 500 mg via INTRAVENOUS
  Filled 2012-12-29 (×27): qty 100

## 2012-12-29 MED ORDER — SODIUM CHLORIDE 0.9 % IV BOLUS (SEPSIS)
250.0000 mL | Freq: Once | INTRAVENOUS | Status: AC
  Administered 2012-12-29: 250 mL via INTRAVENOUS

## 2012-12-29 MED ORDER — LISINOPRIL 5 MG PO TABS
5.0000 mg | ORAL_TABLET | Freq: Every evening | ORAL | Status: DC
  Administered 2012-12-29 – 2013-01-03 (×6): 5 mg via ORAL
  Filled 2012-12-29 (×7): qty 1

## 2012-12-29 MED ORDER — IOHEXOL 300 MG/ML  SOLN
100.0000 mL | Freq: Once | INTRAMUSCULAR | Status: AC | PRN
  Administered 2012-12-29: 100 mL via INTRAVENOUS

## 2012-12-29 MED ORDER — SODIUM CHLORIDE 0.9 % IV BOLUS (SEPSIS)
250.0000 mL | Freq: Once | INTRAVENOUS | Status: AC
  Administered 2012-12-30: 250 mL via INTRAVENOUS

## 2012-12-29 MED ORDER — SUCRALFATE 1 GM/10ML PO SUSP
1.0000 g | Freq: Three times a day (TID) | ORAL | Status: DC
  Administered 2012-12-29 – 2013-01-04 (×23): 1 g via ORAL
  Filled 2012-12-29 (×30): qty 10

## 2012-12-29 MED ORDER — SODIUM CHLORIDE 0.9 % IV SOLN
INTRAVENOUS | Status: DC
  Administered 2012-12-30: 1000 mL via INTRAVENOUS
  Administered 2012-12-30: 21:00:00 via INTRAVENOUS
  Administered 2013-01-01: 1000 mL via INTRAVENOUS
  Administered 2013-01-02 – 2013-01-04 (×2): via INTRAVENOUS

## 2012-12-29 NOTE — Progress Notes (Signed)
Dr. Marina Goodell on call for [facility] paged regarding severe abdominal pain.

## 2012-12-29 NOTE — Consult Note (Addendum)
Reason for Consult: acute cholecystitis Referring Physician: Dr. Jeanine Ward is an 62 y.o. female.  HPI: the patient is a 62 year old female who presents to the ED today ago secondary to midepigastric pain. Patient was admitted and worked up for cardiac event which was negative. The patient on ultrasound initially which showed polyps versus non-mobile stones but no signs of acute cholecystitis. The patient subsequently continued with epigastric and right upper quadrant pain on exam. Gastroenterology was consult and a CT scan was ordered. The CT scan revealed gallbladder wall thickening to 1 cm as well as pericholecystic fluid.  The patient has continued having right upper quadrant abdominal pain, and is not been taking her regular diet as ordered secondary to pain.  The patient also has a history of an MI in September 2013 with subsequent stents x3.  Past Medical History  Diagnosis Date  . Hypertension   . GERD (gastroesophageal reflux disease)   . Hypercholesteremia   . ST elevation myocardial infarction (STEMI) of inferoposterior wall September 2013    S/P DES X 3 RCA, EF 50%  . Chronic hip pain   . Arthritis   . H/O hiatal hernia     Past Surgical History  Procedure Laterality Date  . Tonsillectomy    . Appendectomy    . Vulvectomy    . Breast surgery      No family history on file.  Social History:  reports that she has been smoking.  She does not have any smokeless tobacco history on file. She reports that she does not drink alcohol or use illicit drugs.  Allergies:  Allergies  Allergen Reactions  . Ciprofloxacin Anaphylaxis  . Codeine Other (See Comments)    Unknown reaction  . Erythromycin Nausea And Vomiting  . Penicillins Rash  . Sulfa Antibiotics Rash    Medications: I have reviewed the patient's current medications.  Results for orders placed during the hospital encounter of 12/27/12 (from the past 48 hour(s))  CBC     Status: Abnormal   Collection  Time    12/27/12  8:56 PM      Result Value Range   WBC 13.2 (*) 4.0 - 10.5 K/uL   RBC 4.89  3.87 - 5.11 MIL/uL   Hemoglobin 15.5 (*) 12.0 - 15.0 g/dL   HCT 16.1  09.6 - 04.5 %   MCV 91.2  78.0 - 100.0 fL   MCH 31.7  26.0 - 34.0 pg   MCHC 34.8  30.0 - 36.0 g/dL   RDW 40.9  81.1 - 91.4 %   Platelets 273  150 - 400 K/uL  BASIC METABOLIC PANEL     Status: Abnormal   Collection Time    12/27/12  8:56 PM      Result Value Range   Sodium 138  135 - 145 mEq/L   Potassium 3.9  3.5 - 5.1 mEq/L   Chloride 106  96 - 112 mEq/L   CO2 20  19 - 32 mEq/L   Glucose, Bld 127 (*) 70 - 99 mg/dL   BUN 17  6 - 23 mg/dL   Creatinine, Ser 7.82  0.50 - 1.10 mg/dL   Calcium 9.4  8.4 - 95.6 mg/dL   GFR calc non Af Amer >90  >90 mL/min   GFR calc Af Amer >90  >90 mL/min   Comment:            The eGFR has been calculated     using the CKD EPI  equation.     This calculation has not been     validated in all clinical     situations.     eGFR's persistently     <90 mL/min signify     possible Chronic Kidney Disease.  POCT I-STAT TROPONIN I     Status: None   Collection Time    12/27/12  9:14 PM      Result Value Range   Troponin i, poc 0.00  0.00 - 0.08 ng/mL   Comment 3            Comment: Due to the release kinetics of cTnI,     a negative result within the first hours     of the onset of symptoms does not rule out     myocardial infarction with certainty.     If myocardial infarction is still suspected,     repeat the test at appropriate intervals.  MRSA PCR SCREENING     Status: None   Collection Time    12/28/12  3:02 AM      Result Value Range   MRSA by PCR NEGATIVE  NEGATIVE   Comment:            The GeneXpert MRSA Assay (FDA     approved for NASAL specimens     only), is one component of a     comprehensive MRSA colonization     surveillance program. It is not     intended to diagnose MRSA     infection nor to guide or     monitor treatment for     MRSA infections.  TROPONIN I      Status: None   Collection Time    12/28/12  3:10 AM      Result Value Range   Troponin I <0.30  <0.30 ng/mL   Comment:            Due to the release kinetics of cTnI,     a negative result within the first hours     of the onset of symptoms does not rule out     myocardial infarction with certainty.     If myocardial infarction is still suspected,     repeat the test at appropriate intervals.  PROTIME-INR     Status: None   Collection Time    12/28/12  3:10 AM      Result Value Range   Prothrombin Time 12.2  11.6 - 15.2 seconds   INR 0.91  0.00 - 1.49  APTT     Status: Abnormal   Collection Time    12/28/12  3:10 AM      Result Value Range   aPTT 73 (*) 24 - 37 seconds   Comment:            IF BASELINE aPTT IS ELEVATED,     SUGGEST PATIENT RISK ASSESSMENT     BE USED TO DETERMINE APPROPRIATE     ANTICOAGULANT THERAPY.  CBC WITH DIFFERENTIAL     Status: Abnormal   Collection Time    12/28/12  3:10 AM      Result Value Range   WBC 15.6 (*) 4.0 - 10.5 K/uL   RBC 4.89  3.87 - 5.11 MIL/uL   Hemoglobin 14.8  12.0 - 15.0 g/dL   HCT 40.9  81.1 - 91.4 %   MCV 90.2  78.0 - 100.0 fL   MCH 30.3  26.0 - 34.0 pg   MCHC 33.6  30.0 - 36.0 g/dL   RDW 16.1  09.6 - 04.5 %   Platelets 275  150 - 400 K/uL   Neutrophils Relative 89 (*) 43 - 77 %   Neutro Abs 13.9 (*) 1.7 - 7.7 K/uL   Lymphocytes Relative 8 (*) 12 - 46 %   Lymphs Abs 1.3  0.7 - 4.0 K/uL   Monocytes Relative 2 (*) 3 - 12 %   Monocytes Absolute 0.4  0.1 - 1.0 K/uL   Eosinophils Relative 0  0 - 5 %   Eosinophils Absolute 0.0  0.0 - 0.7 K/uL   Basophils Relative 0  0 - 1 %   Basophils Absolute 0.0  0.0 - 0.1 K/uL  TSH     Status: None   Collection Time    12/28/12  3:10 AM      Result Value Range   TSH 0.683  0.350 - 4.500 uIU/mL  COMPREHENSIVE METABOLIC PANEL     Status: Abnormal   Collection Time    12/28/12  3:10 AM      Result Value Range   Sodium 140  135 - 145 mEq/L   Potassium 4.3  3.5 - 5.1 mEq/L    Chloride 106  96 - 112 mEq/L   CO2 20  19 - 32 mEq/L   Glucose, Bld 157 (*) 70 - 99 mg/dL   BUN 16  6 - 23 mg/dL   Creatinine, Ser 4.09  0.50 - 1.10 mg/dL   Calcium 9.5  8.4 - 81.1 mg/dL   Total Protein 7.4  6.0 - 8.3 g/dL   Albumin 4.0  3.5 - 5.2 g/dL   AST 20  0 - 37 U/L   ALT 19  0 - 35 U/L   Alkaline Phosphatase 148 (*) 39 - 117 U/L   Total Bilirubin 0.2 (*) 0.3 - 1.2 mg/dL   GFR calc non Af Amer >90  >90 mL/min   GFR calc Af Amer >90  >90 mL/min   Comment:            The eGFR has been calculated     using the CKD EPI equation.     This calculation has not been     validated in all clinical     situations.     eGFR's persistently     <90 mL/min signify     possible Chronic Kidney Disease.  MAGNESIUM     Status: None   Collection Time    12/28/12  3:10 AM      Result Value Range   Magnesium 2.0  1.5 - 2.5 mg/dL  LIPID PANEL     Status: Abnormal   Collection Time    12/28/12  3:10 AM      Result Value Range   Cholesterol 257 (*) 0 - 200 mg/dL   Triglycerides 914  <782 mg/dL   HDL 54  >95 mg/dL   Total CHOL/HDL Ratio 4.8     VLDL 28  0 - 40 mg/dL   LDL Cholesterol 621 (*) 0 - 99 mg/dL   Comment:            Total Cholesterol/HDL:CHD Risk     Coronary Heart Disease Risk Table                         Men   Women      1/2 Average Risk   3.4   3.3      Average Risk  5.0   4.4      2 X Average Risk   9.6   7.1      3 X Average Risk  23.4   11.0                Use the calculated Patient Ratio     above and the CHD Risk Table     to determine the patient's CHD Risk.                ATP III CLASSIFICATION (LDL):      <100     mg/dL   Optimal      161-096  mg/dL   Near or Above                        Optimal      130-159  mg/dL   Borderline      045-409  mg/dL   High      >811     mg/dL   Very High  HEPARIN LEVEL (UNFRACTIONATED)     Status: None   Collection Time    12/28/12  7:10 AM      Result Value Range   Heparin Unfractionated 0.35  0.30 - 0.70 IU/mL    Comment:            IF HEPARIN RESULTS ARE BELOW     EXPECTED VALUES, AND PATIENT     DOSAGE HAS BEEN CONFIRMED,     SUGGEST FOLLOW UP TESTING     OF ANTITHROMBIN III LEVELS.  CBC     Status: Abnormal   Collection Time    12/28/12  7:10 AM      Result Value Range   WBC 22.1 (*) 4.0 - 10.5 K/uL   RBC 4.93  3.87 - 5.11 MIL/uL   Hemoglobin 15.7 (*) 12.0 - 15.0 g/dL   HCT 91.4  78.2 - 95.6 %   MCV 90.7  78.0 - 100.0 fL   MCH 31.8  26.0 - 34.0 pg   MCHC 35.1  30.0 - 36.0 g/dL   RDW 21.3  08.6 - 57.8 %   Platelets 267  150 - 400 K/uL  TROPONIN I     Status: None   Collection Time    12/28/12 12:05 PM      Result Value Range   Troponin I <0.30  <0.30 ng/mL   Comment:            Due to the release kinetics of cTnI,     a negative result within the first hours     of the onset of symptoms does not rule out     myocardial infarction with certainty.     If myocardial infarction is still suspected,     repeat the test at appropriate intervals.  URINE CULTURE     Status: None   Collection Time    12/28/12  1:05 PM      Result Value Range   Specimen Description URINE, RANDOM     Special Requests NONE     Culture  Setup Time 12/28/2012 13:43     Colony Count NO GROWTH     Culture NO GROWTH     Report Status 12/29/2012 FINAL    URINALYSIS, ROUTINE W REFLEX MICROSCOPIC     Status: Abnormal   Collection Time    12/28/12  1:05 PM      Result Value Range   Color, Urine YELLOW  YELLOW   APPearance CLEAR  CLEAR   Specific Gravity, Urine 1.007  1.005 - 1.030   pH 6.0  5.0 - 8.0   Glucose, UA NEGATIVE  NEGATIVE mg/dL   Hgb urine dipstick SMALL (*) NEGATIVE   Bilirubin Urine NEGATIVE  NEGATIVE   Ketones, ur NEGATIVE  NEGATIVE mg/dL   Protein, ur NEGATIVE  NEGATIVE mg/dL   Urobilinogen, UA 0.2  0.0 - 1.0 mg/dL   Nitrite NEGATIVE  NEGATIVE   Leukocytes, UA NEGATIVE  NEGATIVE  URINE MICROSCOPIC-ADD ON     Status: None   Collection Time    12/28/12  1:05 PM      Result Value Range    Squamous Epithelial / LPF RARE  RARE   WBC, UA 0-2  <3 WBC/hpf   RBC / HPF 3-6  <3 RBC/hpf   Bacteria, UA RARE  RARE   Urine-Other RARE YEAST    TROPONIN I     Status: None   Collection Time    12/28/12  3:18 PM      Result Value Range   Troponin I <0.30  <0.30 ng/mL   Comment:            Due to the release kinetics of cTnI,     a negative result within the first hours     of the onset of symptoms does not rule out     myocardial infarction with certainty.     If myocardial infarction is still suspected,     repeat the test at appropriate intervals.  HEPARIN LEVEL (UNFRACTIONATED)     Status: Abnormal   Collection Time    12/28/12  3:18 PM      Result Value Range   Heparin Unfractionated 0.23 (*) 0.30 - 0.70 IU/mL   Comment:            IF HEPARIN RESULTS ARE BELOW     EXPECTED VALUES, AND PATIENT     DOSAGE HAS BEEN CONFIRMED,     SUGGEST FOLLOW UP TESTING     OF ANTITHROMBIN III LEVELS.  CBC     Status: Abnormal   Collection Time    12/29/12  5:25 AM      Result Value Range   WBC 21.9 (*) 4.0 - 10.5 K/uL   RBC 4.96  3.87 - 5.11 MIL/uL   Hemoglobin 15.6 (*) 12.0 - 15.0 g/dL   HCT 40.9  81.1 - 91.4 %   MCV 90.7  78.0 - 100.0 fL   MCH 31.5  26.0 - 34.0 pg   MCHC 34.7  30.0 - 36.0 g/dL   RDW 78.2  95.6 - 21.3 %   Platelets 255  150 - 400 K/uL  BASIC METABOLIC PANEL     Status: Abnormal   Collection Time    12/29/12  5:25 AM      Result Value Range   Sodium 137  135 - 145 mEq/L   Potassium 4.3  3.5 - 5.1 mEq/L   Chloride 100  96 - 112 mEq/L   CO2 26  19 - 32 mEq/L   Glucose, Bld 136 (*) 70 - 99 mg/dL   BUN 9  6 - 23 mg/dL   Creatinine, Ser 0.86  0.50 - 1.10 mg/dL   Calcium 9.8  8.4 - 57.8 mg/dL   GFR calc non Af Amer >90  >90 mL/min   GFR calc Af Amer >90  >90 mL/min   Comment:  The eGFR has been calculated     using the CKD EPI equation.     This calculation has not been     validated in all clinical     situations.     eGFR's persistently     <90  mL/min signify     possible Chronic Kidney Disease.  LIPASE, BLOOD     Status: None   Collection Time    12/29/12  5:25 AM      Result Value Range   Lipase 14  11 - 59 U/L    US Abdomen Complete  12/28/2012  *RADIOLOGY REPORT*  Clinical Data:  Epigastric pain and elevated white cell count. History of gallbladder polyps.  COMPLETE ABDOMINAL ULTRASOUND  Comparison:  Renal ultrasound 03/23/2012.  CT abdomen and pelvis 03/26/2008.  Findings:  Gallbladder:  Three focal filling defects are demonstrated in the upper portion of the gallbladder fundus, measuring 7 mm, 8 mm, and 6 mm respectively.  These do not appear changed in position during examination and may represent polyps or non mobile stones.  This finding is more prominent than on the previous ultrasound, or early one small polyp was identified.  The polyp on the previous study was in the same location.  Differential diagnosis includes interval enlargement of polyps, development of stones or sludge, or developing mass in the gallbladder.  Abdominal MRI / MRCP is recommended for further evaluation to exclude early gallbladder carcinoma. There is no gallbladder wall thickening, edema, or sludge.  Common bile duct:  Normal caliber with measured diameter of 4 mm.  Liver:  Diffusely increased hepatic echotexture suggesting diffuse fatty infiltration.  IVC:  Visualized portion of the inferior vena cava is unremarkable.  Pancreas:  Limited visualization of the pancreas due to overlying bowel gas.  Visualized portions of the pancreas are unremarkable.  Spleen:  Spleen length measures 5.6 cm.  Normal parenchymal echotexture.  Right Kidney:  Right kidney measures 10.6 cm length.  No hydronephrosis.  Left Kidney:  Left kidney measures 11.3 cm length.  No hydronephrosis.  Abdominal aorta:  Aorta is predominately obscured by overlying bowel gas is not well visualized.  IMPRESSION: Enlarging polypoid lesion in the gallbladder versus developing stones.  Non emergent  follow-up imaging with MRI / MRCP is recommended to exclude developing gallbladder mass.  No evidence of acute cholecystitis.  Diffuse fatty infiltration of the liver.   Original Report Authenticated By: Burman Nieves, M.D.    Ct Abdomen Pelvis W Contrast  12/29/2012  *RADIOLOGY REPORT*  Clinical Data: Acute right upper quadrant abdominal pain. Leukocytosis.  Nausea.  CT ABDOMEN AND PELVIS WITH CONTRAST  Technique:  Multidetector CT imaging of the abdomen and pelvis was performed following the standard protocol during bolus administration of intravenous contrast.  Contrast: OMNIPAQUE IOHEXOL 300 MG/ML  SOLN  Comparison: CT of the abdomen and pelvis 03/26/2008.  Findings:  Lung Bases: Linear opacities in the lower lobes of the lungs bilaterally most compatible with areas of subsegmental atelectasis and/or scarring.  Within the visualized portions of the right breast there are several areas of soft tissue prominence adjacent to the breast prosthesis, however, these appear very similar to remote prior study from 03/26/2008, and are therefore likely benign.  Atherosclerotic calcifications in the left main, left anterior descending and right coronary arteries.  Small hiatal hernia.  Abdomen/Pelvis:  The gallbladder is moderately distended, and is remarkable for some wall thickening (up to 10 mm) as well as a small amount of pericholecystic fluid and stranding.  No definite radiopaque gallstones are identified.  Common bile duct does not appear dilated, nor does the pancreatic duct.  No focal cystic or solid hepatic lesions.  The appearance of the pancreas, spleen, bilateral adrenal glands and bilateral kidneys is unremarkable. Atherosclerosis throughout the abdominal and pelvic vasculature, without definite aneurysm or dissection.  There is a trace amount of fluid tracking inferiorly from the gallbladder fossa into the right pericolic gutter.  No larger volume of ascites.  No pneumoperitoneum.  No pathologic  distension of small bowel.  There are several colonic diverticula, predominantly in the region of the sigmoid colon, without surrounding inflammatory changes to suggest acute diverticulitis at this time.  Uterus and ovaries are unremarkable in appearance. Urinary bladder is decompressed, but otherwise unremarkable.  Musculoskeletal: There are no aggressive appearing lytic or blastic lesions noted in the visualized portions of the skeleton.  IMPRESSION: 1.  Findings, as above, concerning for acute cholecystitis. 2.  Atelectasis and/or scarring in the lung bases bilaterally. 3.  Colonic diverticulosis without evidence to suggest acute diverticulitis at this time. 4.  Small hiatal hernia.  5. Atherosclerosis, including left main and two-vessel coronary artery disease. Please note that although the presence of coronary artery calcium documents the presence of coronary artery disease, the severity of this disease and any potential stenosis cannot be assessed on this non-gated CT examination.  Assessment for potential risk factor modification, dietary therapy or pharmacologic therapy may be warranted, if clinically indicated.   Original Report Authenticated By: Trudie Reed, M.D.    Nm Myocar Multi W/spect W/wall Motion / Ef  12/28/2012  *RADIOLOGY REPORT*  Clinical Data:  Chest pain.  MYOCARDIAL IMAGING WITH SPECT (REST AND PHARMACOLOGIC-STRESS) GATED LEFT VENTRICULAR WALL MOTION STUDY LEFT VENTRICULAR EJECTION FRACTION  Technique:  Standard myocardial SPECT imaging was performed after resting intravenous injection of 10 mCi Tc-43m sestamibi. Subsequently, intravenous infusion of Lexiscan was performed under the supervision of the Cardiology staff.  At peak effect of the drug, 30 mCi Tc-45m sestamibi was injected intravenously and standard myocardial SPECT imaging was performed.  Quantitative gated imaging was also performed to evaluate left ventricular wall motion and estimate left ventricular ejection fraction.   Comparison:  Chest radiograph 12/27/2012.  Findings:  Fixed defects are present in the septum and base extending to the apex.  There is no reversible ischemia identified. Hypokinesis of the base and septum is noted.  Calculated ejection fraction is 52%.  End-systolic volume 17 ml.  End-diastolic volume is 36 ml.  IMPRESSION:  1.  No evidence of reversible ischemia. 2.  Fixed a decreased perfusion in the inferior wall and septum compatible with prior infarct. 3.  Calculated ejection fraction 52%.   Original Report Authenticated By: Andreas Newport, M.D.    Dg Chest Portable 1 View  12/27/2012  *RADIOLOGY REPORT*  Clinical Data: Chest pain.  PORTABLE CHEST - 1 VIEW  Comparison: 08/29/2012  Findings: Lung volumes are relatively low.  No edema, infiltrate or pleural fluid is seen.  Heart size is normal.  IMPRESSION: Low lung volumes.  No active disease.   Original Report Authenticated By: Irish Lack, M.D.    Dg Abd 2 Views  12/28/2012  *RADIOLOGY REPORT*  Clinical Data: Abdominal pain and nausea.  ABDOMEN - 2 VIEW  Comparison: None.  Findings: The bowel gas pattern is normal.  No evidence of bowel obstruction or significant ileus.  No free air is identified.  No abnormal calcifications are seen.  Bony structures show degenerative changes and leftward convex scoliosis  of the lumbar spine.  IMPRESSION: Normal bowel gas pattern.   Original Report Authenticated By: Irish Lack, M.D.     Review of Systems  Constitutional: Negative.   HENT: Negative.   Eyes: Negative.   Respiratory: Negative.   Cardiovascular: Negative.   Gastrointestinal: Positive for nausea, vomiting and abdominal pain. Negative for diarrhea.  Genitourinary: Negative.   Musculoskeletal: Negative.   Skin: Negative.   Neurological: Negative.    Blood pressure 91/48, pulse 98, temperature 98.6 F (37 C), temperature source Oral, resp. rate 19, height 5\' 5"  (1.651 m), weight 184 lb 4.9 oz (83.6 kg), SpO2 95.00%. Physical Exam   Constitutional: She is oriented to person, place, and time. She appears well-developed and well-nourished.  HENT:  Head: Normocephalic and atraumatic.  Eyes: Conjunctivae and EOM are normal. Pupils are equal, round, and reactive to light.  Neck: Normal range of motion. Neck supple.  Cardiovascular: Normal rate, regular rhythm and normal heart sounds.   Respiratory: Effort normal and breath sounds normal.  GI: Soft. Bowel sounds are normal. She exhibits no mass. There is tenderness (ruq). There is no rebound and no guarding.  Musculoskeletal: Normal range of motion.  Neurological: She is alert and oriented to person, place, and time.    Assessment/Plan: The patient is a 62 year old female with likely acute cholecystitis. Patient has had continued elevated WBC count, was initially admitted with an elevated alkaline phosphatase. Otherwise LFTs have been within normal limits. The patient is currently on Brinlinta which is similar to Plavix.  Plan: 1. The patient will be placed on clear liquid diet. 2. The patient will continue on antibiotics. 3. We'll discuss with cardiology in the patient is able to be off her Brilinta or have that reversed to undergo laparoscopic cholecystectomy.   Marigene Ehlers., [name] 12/29/2012, 5:54 PM

## 2012-12-29 NOTE — Progress Notes (Signed)
Pt c/o severe abdominal pain and nausea. Given Zofran 4mg  IV followed by Morphine 2mg  IV. Pt states she has not been in this much discomfort since admission. States Morphine ineffective. Dr. Sharyn Lull paged.

## 2012-12-29 NOTE — Progress Notes (Signed)
Subjective:  Patient continues to have abdominal pain. Denies any nausea or vomiting or diarrhea. Denies any further chest pains. Nuclear stress test negative for ischemia  Objective:  Vital Signs in the last 24 hours: Temp:  [98.1 F (36.7 C)-99.9 F (37.7 C)] 98.3 F (36.8 C) (04/26 0757) Pulse Rate:  [68-106] 80 (04/26 0900) Resp:  [12-26] 20 (04/26 0900) BP: (108-148)/(54-86) 134/70 mmHg (04/26 0800) SpO2:  [95 %-98 %] 97 % (04/26 0900) Weight:  [83.6 kg (184 lb 4.9 oz)] 83.6 kg (184 lb 4.9 oz) (04/26 0421)  Intake/Output from previous day: 04/25 0701 - 04/26 0700 In: 120 [P.O.:120] Out: 800 [Urine:800] Intake/Output from this shift: Total I/O In: 360 [P.O.:360] Out: -   Physical Exam: Neck: no adenopathy, no carotid bruit, no JVD and supple, symmetrical, trachea midline Lungs: clear to auscultation bilaterally  Lab Results:  Recent Labs  12/28/12 0710 12/29/12 0525  WBC 22.1* 21.9*  HGB 15.7* 15.6*  PLT 267 255    Recent Labs  12/28/12 0310 12/29/12 0525  NA 140 137  K 4.3 4.3  CL 106 100  CO2 20 26  GLUCOSE 157* 136*  BUN 16 9  CREATININE 0.56 0.66    Recent Labs  12/28/12 1205 12/28/12 1518  TROPONINI <0.30 <0.30   Hepatic Function Panel  Recent Labs  12/28/12 0310  PROT 7.4  ALBUMIN 4.0  AST 20  ALT 19  ALKPHOS 148*  BILITOT 0.2*    Recent Labs  12/28/12 0310  CHOL 257*   No results found for this basename: PROTIME,  in the last 72 hours  Imaging: Imaging results have been reviewed and US Abdomen Complete  12/28/2012  *RADIOLOGY REPORT*  Clinical Data:  Epigastric pain and elevated white cell count. History of gallbladder polyps.  COMPLETE ABDOMINAL ULTRASOUND  Comparison:  Renal ultrasound 03/23/2012.  CT abdomen and pelvis 03/26/2008.  Findings:  Gallbladder:  Three focal filling defects are demonstrated in the upper portion of the gallbladder fundus, measuring 7 mm, 8 mm, and 6 mm respectively.  These do not appear changed in  position during examination and may represent polyps or non mobile stones.  This finding is more prominent than on the previous ultrasound, or early one small polyp was identified.  The polyp on the previous study was in the same location.  Differential diagnosis includes interval enlargement of polyps, development of stones or sludge, or developing mass in the gallbladder.  Abdominal MRI / MRCP is recommended for further evaluation to exclude early gallbladder carcinoma. There is no gallbladder wall thickening, edema, or sludge.  Common bile duct:  Normal caliber with measured diameter of 4 mm.  Liver:  Diffusely increased hepatic echotexture suggesting diffuse fatty infiltration.  IVC:  Visualized portion of the inferior vena cava is unremarkable.  Pancreas:  Limited visualization of the pancreas due to overlying bowel gas.  Visualized portions of the pancreas are unremarkable.  Spleen:  Spleen length measures 5.6 cm.  Normal parenchymal echotexture.  Right Kidney:  Right kidney measures 10.6 cm length.  No hydronephrosis.  Left Kidney:  Left kidney measures 11.3 cm length.  No hydronephrosis.  Abdominal aorta:  Aorta is predominately obscured by overlying bowel gas is not well visualized.  IMPRESSION: Enlarging polypoid lesion in the gallbladder versus developing stones.  Non emergent follow-up imaging with MRI / MRCP is recommended to exclude developing gallbladder mass.  No evidence of acute cholecystitis.  Diffuse fatty infiltration of the liver.   Original Report Authenticated By: Burman Nieves,  M.D.    Nm Myocar Multi W/spect W/wall Motion / Ef  12/28/2012  *RADIOLOGY REPORT*  Clinical Data:  Chest pain.  MYOCARDIAL IMAGING WITH SPECT (REST AND PHARMACOLOGIC-STRESS) GATED LEFT VENTRICULAR WALL MOTION STUDY LEFT VENTRICULAR EJECTION FRACTION  Technique:  Standard myocardial SPECT imaging was performed after resting intravenous injection of 10 mCi Tc-13m sestamibi. Subsequently, intravenous infusion of  Lexiscan was performed under the supervision of the Cardiology staff.  At peak effect of the drug, 30 mCi Tc-55m sestamibi was injected intravenously and standard myocardial SPECT imaging was performed.  Quantitative gated imaging was also performed to evaluate left ventricular wall motion and estimate left ventricular ejection fraction.  Comparison:  Chest radiograph 12/27/2012.  Findings:  Fixed defects are present in the septum and base extending to the apex.  There is no reversible ischemia identified. Hypokinesis of the base and septum is noted.  Calculated ejection fraction is 52%.  End-systolic volume 17 ml.  End-diastolic volume is 36 ml.  IMPRESSION:  1.  No evidence of reversible ischemia. 2.  Fixed a decreased perfusion in the inferior wall and septum compatible with prior infarct. 3.  Calculated ejection fraction 52%.   Original Report Authenticated By: Andreas Newport, M.D.    Dg Chest Portable 1 View  12/27/2012  *RADIOLOGY REPORT*  Clinical Data: Chest pain.  PORTABLE CHEST - 1 VIEW  Comparison: 08/29/2012  Findings: Lung volumes are relatively low.  No edema, infiltrate or pleural fluid is seen.  Heart size is normal.  IMPRESSION: Low lung volumes.  No active disease.   Original Report Authenticated By: Irish Lack, M.D.    Dg Abd 2 Views  12/28/2012  *RADIOLOGY REPORT*  Clinical Data: Abdominal pain and nausea.  ABDOMEN - 2 VIEW  Comparison: None.  Findings: The bowel gas pattern is normal.  No evidence of bowel obstruction or significant ileus.  No free air is identified.  No abnormal calcifications are seen.  Bony structures show degenerative changes and leftward convex scoliosis of the lumbar spine.  IMPRESSION: Normal bowel gas pattern.   Original Report Authenticated By: Irish Lack, M.D.     Cardiac Studies:  Assessment/Plan:  Status post Recurrent chest pain MI rule out, negative nuclear stress test Coronary artery disease history of and inferior posterior wall MI status  post PCI 100% occluded RCA in September of 2013  Hypertension  History of V. fib arrest in the past  Hypercholesteremia  COPD  History of tobacco abuse  Positive family history of coronary artery disease  Abdominal pain  workup in progress per GI Degenerative joint disease  Marked leukocytosis questionable etiology Plan As per orders  LOS: 2 days    [name] 12/29/2012, 10:15 AM

## 2012-12-29 NOTE — Progress Notes (Signed)
Patient ID: [name], female   DOB: [birth year], 62 y.o.   MRN: [id number] [facility] Progress Note  Subjective: Pain about the same, "very tender" RUQ primarily, fells bloated, somewhat nauseated.  Tmax 99.9 Abd [place]- no cholecystitis, she does have GB polyps vs adherent stones  CBD nondilated WBC 21.9  Objective:  Vital signs in last 24 hours: Temp:  [98.1 F (36.7 C)-99.9 F (37.7 C)] 98.3 F (36.8 C) (04/26 0757) Pulse Rate:  [68-106] 80 (04/26 0900) Resp:  [12-26] 20 (04/26 0900) BP: (108-148)/(54-86) 134/70 mmHg (04/26 0800) SpO2:  [95 %-98 %] 97 % (04/26 0900) Weight:  [184 lb 4.9 oz (83.6 kg)] 184 lb 4.9 oz (83.6 kg) (04/26 0421) Last BM Date: 12/26/12 General:   Alert,  Well-developed,  WF uncomfortable appearing,  in NAD Heart:  Regular rate and rhythm; no murmurs Pulm;clear Abdomen:  Soft, Bs quiet, tender Epigastrium and RUQ no rebound Extremities:  Without edema. Neurologic:  Alert and  oriented x4;  grossly normal neurologically. Psych:  Alert and cooperative. Normal mood and affect.  Intake/Output from previous day: 04/25 0701 - 04/26 0700 In: 120 [P.O.:120] Out: 800 [Urine:800] Intake/Output this shift: Total I/O In: 360 [P.O.:360] Out: -   Lab Results:  Recent Labs  12/28/12 0310 12/28/12 0710 12/29/12 0525  WBC 15.6* 22.1* 21.9*  HGB 14.8 15.7* 15.6*  HCT 44.1 44.7 45.0  PLT 275 267 255   BMET  Recent Labs  12/27/12 2056 12/28/12 0310 12/29/12 0525  NA 138 140 137  K 3.9 4.3 4.3  CL 106 106 100  CO2 20 20 26   GLUCOSE 127* 157* 136*  BUN 17 16 9   CREATININE 0.57 0.56 0.66  CALCIUM 9.4 9.5 9.8   LFT  Recent Labs  12/28/12 0310  PROT 7.4  ALBUMIN 4.0  AST 20  ALT 19  ALKPHOS 148*  BILITOT 0.2*   PT/INR  Recent Labs  12/28/12 0310  LABPROT 12.2  INR 0.91      Assessment / Plan: #1  62 yo female with acute upper abdominal pain x 48 hours, milder intermittent sxs x one month-etiology unclear but  clearly has an acute intraabdominal inflammatory process [place] nondiagnostic  Will order stat CT abd/pelvis Continue clears  On IV Rocephin and flagyl Further w/u pending CT   LOS: 2 days   Amy Esterwood  12/29/2012, 10:24 AM   GI ATTENDING  History, laboratories, x-rays reviewed. Patient personally seen and examined. She continues with right upper quadrant pain requiring narcotic analgesia and leukocytosis. Abdominal ultrasound nondiagnostic. However, CT scan shows changes consistent with acute cholecystitis. This this clinically. I have informed the patient of this information and contacted the general surgeon on-call, Dr. Derrell Lolling. Continue IV antibiotics.  Wilhemina Bonito. Eda Keys., M.D. [facility] Division of Gastroenterology

## 2012-12-30 LAB — BASIC METABOLIC PANEL
BUN: 13 mg/dL (ref 6–23)
Chloride: 102 mEq/L (ref 96–112)
GFR calc Af Amer: 79 mL/min — ABNORMAL LOW (ref >90)
GFR calc non Af Amer: 68 mL/min — ABNORMAL LOW (ref >90)
Potassium: 3.8 mEq/L (ref 3.5–5.1)
Sodium: 137 mEq/L (ref 135–145)

## 2012-12-30 LAB — CBC
HCT: 39.1 % (ref 36.0–46.0)
Hemoglobin: 13.5 g/dL (ref 12.0–15.0)
MCHC: 34.5 g/dL (ref 30.0–36.0)
RDW: 14.7 % (ref 11.5–15.5)
WBC: 16.4 10*3/uL — ABNORMAL HIGH (ref 4.0–10.5)

## 2012-12-30 NOTE — Progress Notes (Signed)
Patient ID: [name], female   DOB: October 21, 1950, 62 y.o.   MRN: [id number] [facility] Progress Note  Subjective: Still hurting-had a bad episode during the night, and starting to increase in intensity again now. No vomiting.   CT scan shows acute cholecytitis WBC down to 16.4  Objective:  Vital signs in last 24 hours: Temp:  [98.1 F (36.7 C)-98.7 F (37.1 C)] 98.2 F (36.8 C) (04/27 1127) Pulse Rate:  [58-117] 92 (04/27 1127) Resp:  [11-24] 15 (04/27 1127) BP: (63-134)/(40-75) 112/56 mmHg (04/27 1127) SpO2:  [91 %-98 %] 92 % (04/27 1127) Weight:  [182 lb 15.7 oz (83 kg)] 182 lb 15.7 oz (83 kg) (04/27 0500) Last BM Date: 12/26/12 General:   Alert,  Well-developed, WF   in NAD Heart:  Regular rate and rhythm; no murmurs Pulm;clear Abdomen:  Soft,tenderepigastrium and RUQ with guarding. Normal bowel sounds,  Extremities:  Without edema. Neurologic:  Alert and  oriented x4;  grossly normal neurologically. Psych:  Alert and cooperative. Normal mood and affect.  Intake/Output from previous day: 04/26 0701 - 04/27 0700 In: 2350 [P.O.:600; I.V.:300; IV Piggyback:1450] Out: -  Intake/Output this shift: Total I/O In: 50 [I.V.:50] Out: -   Lab Results:  Recent Labs  12/28/12 0710 12/29/12 0525 12/30/12 0600  WBC 22.1* 21.9* 16.4*  HGB 15.7* 15.6* 13.5  HCT 44.7 45.0 39.1  PLT 267 255 225   BMET  Recent Labs  12/28/12 0310 12/29/12 0525 12/30/12 0600  NA 140 137 137  K 4.3 4.3 3.8  CL 106 100 102  CO2 20 26 23   GLUCOSE 157* 136* 121*  BUN 16 9 13   CREATININE 0.56 0.66 0.88  CALCIUM 9.5 9.8 8.9   LFT  Recent Labs  12/28/12 0310  PROT 7.4  ALBUMIN 4.0  AST 20  ALT 19  ALKPHOS 148*  BILITOT 0.2*   PT/INR  Recent Labs  12/28/12 0310  LABPROT 12.2  INR 0.91      Assessment / Plan: #1 63 yo female with CAD ,on Brilinta  With acute Cholecystitis On Rocephin and Flagyl Iv Brilinta on hold-awaiting clearance of anticoagulant  -then surgery. GI signing off-available if needed. Active Problems:   * No active hospital problems. *     LOS: 3 days   Amy Esterwood  12/30/2012, 12:16 PM   GI ATTENDING  Patient seen and examined. Agree with H&P as outlined above. As well laboratories have been reviewed. Appreciate surgical help. Patient with acute cholecystitis. Timing of surgery to be determined. GI will sign off. Dr. Elnoria Howard available as needed. Thank you  Wilhemina Bonito. Eda Keys., M.D. [facility] Division of Gastroenterology

## 2012-12-30 NOTE — Progress Notes (Signed)
Patient ID: [name], female   DOB: 07-16-1951, 62 y.o.   MRN: [id number]    Subjective: Pt still c/o RUQ abdominal pain.  No nausea or vomiting.  Some SOB at night, but after pain med given for pain, it goes away  Objective: Vital signs in last 24 hours: Temp:  [98.1 F (36.7 C)-98.7 F (37.1 C)] 98.2 F (36.8 C) (04/27 0757) Pulse Rate:  [58-117] 80 (04/27 0948) Resp:  [11-24] 16 (04/27 0948) BP: (63-134)/(40-75) 101/62 mmHg (04/27 0948) SpO2:  [91 %-98 %] 95 % (04/27 0948) Weight:  [182 lb 15.7 oz (83 kg)] 182 lb 15.7 oz (83 kg) (04/27 0500) Last BM Date: 12/26/12  Intake/Output from previous day: 04/26 0701 - 04/27 0700 In: 2350 [P.O.:600; I.V.:300; IV Piggyback:1450] Out: -  Intake/Output this shift: Total I/O In: 50 [I.V.:50] Out: -   PE: Abd: soft, tender in RUQ, few BS, ND Heart: regular Lungs: CTAB  Lab Results:   Recent Labs  12/29/12 0525 12/30/12 0600  WBC 21.9* 16.4*  HGB 15.6* 13.5  HCT 45.0 39.1  PLT 255 225   BMET  Recent Labs  12/29/12 0525 12/30/12 0600  NA 137 137  K 4.3 3.8  CL 100 102  CO2 26 23  GLUCOSE 136* 121*  BUN 9 13  CREATININE 0.66 0.88  CALCIUM 9.8 8.9   PT/INR  Recent Labs  12/28/12 0310  LABPROT 12.2  INR 0.91   CMP     Component Value Date/Time   NA 137 12/30/2012 0600   K 3.8 12/30/2012 0600   CL 102 12/30/2012 0600   CO2 23 12/30/2012 0600   GLUCOSE 121* 12/30/2012 0600   BUN 13 12/30/2012 0600   CREATININE 0.88 12/30/2012 0600   CREATININE 0.66 02/08/2012 0947   CALCIUM 8.9 12/30/2012 0600   PROT 7.4 12/28/2012 0310   ALBUMIN 4.0 12/28/2012 0310   AST 20 12/28/2012 0310   ALT 19 12/28/2012 0310   ALKPHOS 148* 12/28/2012 0310   BILITOT 0.2* 12/28/2012 0310   GFRNONAA 68* 12/30/2012 0600   GFRAA 79* 12/30/2012 0600   Lipase     Component Value Date/Time   LIPASE 14 12/29/2012 0525       Studies/Results: US Abdomen Complete  12/28/2012  *RADIOLOGY REPORT*  Clinical Data:  Epigastric pain and elevated  white cell count. History of gallbladder polyps.  COMPLETE ABDOMINAL ULTRASOUND  Comparison:  Renal ultrasound 03/23/2012.  CT abdomen and pelvis 03/26/2008.  Findings:  Gallbladder:  Three focal filling defects are demonstrated in the upper portion of the gallbladder fundus, measuring 7 mm, 8 mm, and 6 mm respectively.  These do not appear changed in position during examination and may represent polyps or non mobile stones.  This finding is more prominent than on the previous ultrasound, or early one small polyp was identified.  The polyp on the previous study was in the same location.  Differential diagnosis includes interval enlargement of polyps, development of stones or sludge, or developing mass in the gallbladder.  Abdominal MRI / MRCP is recommended for further evaluation to exclude early gallbladder carcinoma. There is no gallbladder wall thickening, edema, or sludge.  Common bile duct:  Normal caliber with measured diameter of 4 mm.  Liver:  Diffusely increased hepatic echotexture suggesting diffuse fatty infiltration.  IVC:  Visualized portion of the inferior vena cava is unremarkable.  Pancreas:  Limited visualization of the pancreas due to overlying bowel gas.  Visualized portions of the pancreas are unremarkable.  Spleen:  Spleen length measures 5.6 cm.  Normal parenchymal echotexture.  Right Kidney:  Right kidney measures 10.6 cm length.  No hydronephrosis.  Left Kidney:  Left kidney measures 11.3 cm length.  No hydronephrosis.  Abdominal aorta:  Aorta is predominately obscured by overlying bowel gas is not well visualized.  IMPRESSION: Enlarging polypoid lesion in the gallbladder versus developing stones.  Non emergent follow-up imaging with MRI / MRCP is recommended to exclude developing gallbladder mass.  No evidence of acute cholecystitis.  Diffuse fatty infiltration of the liver.   Original Report Authenticated By: Burman Nieves, M.D.    Ct Abdomen Pelvis W Contrast  12/29/2012  *RADIOLOGY  REPORT*  Clinical Data: Acute right upper quadrant abdominal pain. Leukocytosis.  Nausea.  CT ABDOMEN AND PELVIS WITH CONTRAST  Technique:  Multidetector CT imaging of the abdomen and pelvis was performed following the standard protocol during bolus administration of intravenous contrast.  Contrast: OMNIPAQUE IOHEXOL 300 MG/ML  SOLN  Comparison: CT of the abdomen and pelvis 03/26/2008.  Findings:  Lung Bases: Linear opacities in the lower lobes of the lungs bilaterally most compatible with areas of subsegmental atelectasis and/or scarring.  Within the visualized portions of the right breast there are several areas of soft tissue prominence adjacent to the breast prosthesis, however, these appear very similar to remote prior study from 03/26/2008, and are therefore likely benign.  Atherosclerotic calcifications in the left main, left anterior descending and right coronary arteries.  Small hiatal hernia.  Abdomen/Pelvis:  The gallbladder is moderately distended, and is remarkable for some wall thickening (up to 10 mm) as well as a small amount of pericholecystic fluid and stranding.  No definite radiopaque gallstones are identified.  Common bile duct does not appear dilated, nor does the pancreatic duct.  No focal cystic or solid hepatic lesions.  The appearance of the pancreas, spleen, bilateral adrenal glands and bilateral kidneys is unremarkable. Atherosclerosis throughout the abdominal and pelvic vasculature, without definite aneurysm or dissection.  There is a trace amount of fluid tracking inferiorly from the gallbladder fossa into the right pericolic gutter.  No larger volume of ascites.  No pneumoperitoneum.  No pathologic distension of small bowel.  There are several colonic diverticula, predominantly in the region of the sigmoid colon, without surrounding inflammatory changes to suggest acute diverticulitis at this time.  Uterus and ovaries are unremarkable in appearance. Urinary bladder is  decompressed, but otherwise unremarkable.  Musculoskeletal: There are no aggressive appearing lytic or blastic lesions noted in the visualized portions of the skeleton.  IMPRESSION: 1.  Findings, as above, concerning for acute cholecystitis. 2.  Atelectasis and/or scarring in the lung bases bilaterally. 3.  Colonic diverticulosis without evidence to suggest acute diverticulitis at this time. 4.  Small hiatal hernia.  5. Atherosclerosis, including left main and two-vessel coronary artery disease. Please note that although the presence of coronary artery calcium documents the presence of coronary artery disease, the severity of this disease and any potential stenosis cannot be assessed on this non-gated CT examination.  Assessment for potential risk factor modification, dietary therapy or pharmacologic therapy may be warranted, if clinically indicated.   Original Report Authenticated By: Trudie Reed, M.D.    Nm Myocar Multi W/spect W/wall Motion / Ef  12/28/2012  *RADIOLOGY REPORT*  Clinical Data:  Chest pain.  MYOCARDIAL IMAGING WITH SPECT (REST AND PHARMACOLOGIC-STRESS) GATED LEFT VENTRICULAR WALL MOTION STUDY LEFT VENTRICULAR EJECTION FRACTION  Technique:  Standard myocardial SPECT imaging was performed after resting intravenous injection of 10  mCi Tc-56m sestamibi. Subsequently, intravenous infusion of Lexiscan was performed under the supervision of the Cardiology staff.  At peak effect of the drug, 30 mCi Tc-31m sestamibi was injected intravenously and standard myocardial SPECT imaging was performed.  Quantitative gated imaging was also performed to evaluate left ventricular wall motion and estimate left ventricular ejection fraction.  Comparison:  Chest radiograph 12/27/2012.  Findings:  Fixed defects are present in the septum and base extending to the apex.  There is no reversible ischemia identified. Hypokinesis of the base and septum is noted.  Calculated ejection fraction is 52%.  End-systolic volume  17 ml.  End-diastolic volume is 36 ml.  IMPRESSION:  1.  No evidence of reversible ischemia. 2.  Fixed a decreased perfusion in the inferior wall and septum compatible with prior infarct. 3.  Calculated ejection fraction 52%.   Original Report Authenticated By: Andreas Newport, M.D.     Anti-infectives: Anti-infectives   Start     Dose/Rate Route Frequency Ordered Stop   12/29/12 1200  metroNIDAZOLE (FLAGYL) IVPB 500 mg     500 mg 100 mL/hr over 60 Minutes Intravenous 4 times per day 12/29/12 1021     12/28/12 2200  cefTRIAXone (ROCEPHIN) 1 g in dextrose 5 % 50 mL IVPB     1 g 100 mL/hr over 30 Minutes Intravenous Every 12 hours 12/28/12 1523     12/28/12 0800  cefTRIAXone (ROCEPHIN) 1 g in dextrose 5 % 50 mL IVPB  Status:  Discontinued     1 g 100 mL/hr over 30 Minutes Intravenous Every 24 hours 12/28/12 0754 12/28/12 1523   12/28/12 0800  azithromycin (ZITHROMAX) 500 mg in dextrose 5 % 250 mL IVPB  Status:  Discontinued     500 mg 250 mL/hr over 60 Minutes Intravenous Every 24 hours 12/28/12 0754 12/28/12 1548       Assessment/Plan  1. Acute cholecystitis Patient Active Problem List  Diagnosis  . Chest pain, mid sternal  . GERD (gastroesophageal reflux disease)  . Hypertension  h/o MI in September 2013 with DES on Brilinta, now stopped on 12-29-12  Plan: 1. Dr. Sharyn Lull has cleared the patient for surgery and stopped her Brilinta.  If plans for surgery within the next couple of days, there is no need for Integrilin bridge. 2. Will make NPO p MN and let Dr. Magnus Ivan evaluate her in the morning to see if he wants to proceed then or not. 3. Cont abx therapy   LOS: 3 days    Chelan Heringer E 12/30/2012, 9:56 AM Pager: [phone]

## 2012-12-30 NOTE — Progress Notes (Signed)
Patient examined and I agree with the assessment and plan No integrilin window needed per Dr. Sharyn Lull Recommendation by manufacturer is hold Brilinta 5d prior to planned surgery. Violeta Gelinas, MD, MPH, FACS Pager: [phone]  12/30/2012 4:46 PM

## 2012-12-30 NOTE — Progress Notes (Signed)
Subjective:  Patient denies any chest pain or shortness of breath. Brilinta has been DC'd yesterday. Had episode of hypotension requiring IV fluids last night. Remains afebrile abdominal pain slightly improved white count trending down  Objective:  Vital Signs in the last 24 hours: Temp:  [98.1 F (36.7 C)-98.7 F (37.1 C)] 98.2 F (36.8 C) (04/27 0757) Pulse Rate:  [58-117] 82 (04/27 0800) Resp:  [11-24] 19 (04/27 0800) BP: (63-134)/(40-75) 95/75 mmHg (04/27 0800) SpO2:  [91 %-98 %] 93 % (04/27 0800) Weight:  [83 kg (182 lb 15.7 oz)] 83 kg (182 lb 15.7 oz) (04/27 0500)  Intake/Output from previous day: 04/26 0701 - 04/27 0700 In: 2350 [P.O.:600; I.V.:300; IV Piggyback:1450] Out: -  Intake/Output from this shift: Total I/O In: 50 [I.V.:50] Out: -   Physical Exam: Neck: no adenopathy, no carotid bruit, no JVD and supple, symmetrical, trachea midline Lungs: clear to auscultation bilaterally Heart: regular rate and rhythm, S1, S2 normal, no murmur, click, rub or gallop Abdomen: Soft bowel sounds present mild right upper quadrant and lower quadrant tenderness noted no guarding Extremities: extremities normal, atraumatic, no cyanosis or edema  Lab Results:  Recent Labs  12/29/12 0525 12/30/12 0600  WBC 21.9* 16.4*  HGB 15.6* 13.5  PLT 255 225    Recent Labs  12/29/12 0525 12/30/12 0600  NA 137 137  K 4.3 3.8  CL 100 102  CO2 26 23  GLUCOSE 136* 121*  BUN 9 13  CREATININE 0.66 0.88    Recent Labs  12/28/12 1205 12/28/12 1518  TROPONINI <0.30 <0.30   Hepatic Function Panel  Recent Labs  12/28/12 0310  PROT 7.4  ALBUMIN 4.0  AST 20  ALT 19  ALKPHOS 148*  BILITOT 0.2*    Recent Labs  12/28/12 0310  CHOL 257*   No results found for this basename: PROTIME,  in the last 72 hours  Imaging: Imaging results have been reviewed and US Abdomen Complete  12/28/2012  *RADIOLOGY REPORT*  Clinical Data:  Epigastric pain and elevated white cell count.  History of gallbladder polyps.  COMPLETE ABDOMINAL ULTRASOUND  Comparison:  Renal ultrasound 03/23/2012.  CT abdomen and pelvis 03/26/2008.  Findings:  Gallbladder:  Three focal filling defects are demonstrated in the upper portion of the gallbladder fundus, measuring 7 mm, 8 mm, and 6 mm respectively.  These do not appear changed in position during examination and may represent polyps or non mobile stones.  This finding is more prominent than on the previous ultrasound, or early one small polyp was identified.  The polyp on the previous study was in the same location.  Differential diagnosis includes interval enlargement of polyps, development of stones or sludge, or developing mass in the gallbladder.  Abdominal MRI / MRCP is recommended for further evaluation to exclude early gallbladder carcinoma. There is no gallbladder wall thickening, edema, or sludge.  Common bile duct:  Normal caliber with measured diameter of 4 mm.  Liver:  Diffusely increased hepatic echotexture suggesting diffuse fatty infiltration.  IVC:  Visualized portion of the inferior vena cava is unremarkable.  Pancreas:  Limited visualization of the pancreas due to overlying bowel gas.  Visualized portions of the pancreas are unremarkable.  Spleen:  Spleen length measures 5.6 cm.  Normal parenchymal echotexture.  Right Kidney:  Right kidney measures 10.6 cm length.  No hydronephrosis.  Left Kidney:  Left kidney measures 11.3 cm length.  No hydronephrosis.  Abdominal aorta:  Aorta is predominately obscured by overlying bowel gas is not  well visualized.  IMPRESSION: Enlarging polypoid lesion in the gallbladder versus developing stones.  Non emergent follow-up imaging with MRI / MRCP is recommended to exclude developing gallbladder mass.  No evidence of acute cholecystitis.  Diffuse fatty infiltration of the liver.   Original Report Authenticated By: Burman Nieves, M.D.    Ct Abdomen Pelvis W Contrast  12/29/2012  *RADIOLOGY REPORT*  Clinical  Data: Acute right upper quadrant abdominal pain. Leukocytosis.  Nausea.  CT ABDOMEN AND PELVIS WITH CONTRAST  Technique:  Multidetector CT imaging of the abdomen and pelvis was performed following the standard protocol during bolus administration of intravenous contrast.  Contrast: OMNIPAQUE IOHEXOL 300 MG/ML  SOLN  Comparison: CT of the abdomen and pelvis 03/26/2008.  Findings:  Lung Bases: Linear opacities in the lower lobes of the lungs bilaterally most compatible with areas of subsegmental atelectasis and/or scarring.  Within the visualized portions of the right breast there are several areas of soft tissue prominence adjacent to the breast prosthesis, however, these appear very similar to remote prior study from 03/26/2008, and are therefore likely benign.  Atherosclerotic calcifications in the left main, left anterior descending and right coronary arteries.  Small hiatal hernia.  Abdomen/Pelvis:  The gallbladder is moderately distended, and is remarkable for some wall thickening (up to 10 mm) as well as a small amount of pericholecystic fluid and stranding.  No definite radiopaque gallstones are identified.  Common bile duct does not appear dilated, nor does the pancreatic duct.  No focal cystic or solid hepatic lesions.  The appearance of the pancreas, spleen, bilateral adrenal glands and bilateral kidneys is unremarkable. Atherosclerosis throughout the abdominal and pelvic vasculature, without definite aneurysm or dissection.  There is a trace amount of fluid tracking inferiorly from the gallbladder fossa into the right pericolic gutter.  No larger volume of ascites.  No pneumoperitoneum.  No pathologic distension of small bowel.  There are several colonic diverticula, predominantly in the region of the sigmoid colon, without surrounding inflammatory changes to suggest acute diverticulitis at this time.  Uterus and ovaries are unremarkable in appearance. Urinary bladder is decompressed, but otherwise  unremarkable.  Musculoskeletal: There are no aggressive appearing lytic or blastic lesions noted in the visualized portions of the skeleton.  IMPRESSION: 1.  Findings, as above, concerning for acute cholecystitis. 2.  Atelectasis and/or scarring in the lung bases bilaterally. 3.  Colonic diverticulosis without evidence to suggest acute diverticulitis at this time. 4.  Small hiatal hernia.  5. Atherosclerosis, including left main and two-vessel coronary artery disease. Please note that although the presence of coronary artery calcium documents the presence of coronary artery disease, the severity of this disease and any potential stenosis cannot be assessed on this non-gated CT examination.  Assessment for potential risk factor modification, dietary therapy or pharmacologic therapy may be warranted, if clinically indicated.   Original Report Authenticated By: Trudie Reed, M.D.    Nm Myocar Multi W/spect W/wall Motion / Ef  12/28/2012  *RADIOLOGY REPORT*  Clinical Data:  Chest pain.  MYOCARDIAL IMAGING WITH SPECT (REST AND PHARMACOLOGIC-STRESS) GATED LEFT VENTRICULAR WALL MOTION STUDY LEFT VENTRICULAR EJECTION FRACTION  Technique:  Standard myocardial SPECT imaging was performed after resting intravenous injection of 10 mCi Tc-40m sestamibi. Subsequently, intravenous infusion of Lexiscan was performed under the supervision of the Cardiology staff.  At peak effect of the drug, 30 mCi Tc-35m sestamibi was injected intravenously and standard myocardial SPECT imaging was performed.  Quantitative gated imaging was also performed to evaluate left ventricular  wall motion and estimate left ventricular ejection fraction.  Comparison:  Chest radiograph 12/27/2012.  Findings:  Fixed defects are present in the septum and base extending to the apex.  There is no reversible ischemia identified. Hypokinesis of the base and septum is noted.  Calculated ejection fraction is 52%.  End-systolic volume 17 ml.  End-diastolic volume  is 36 ml.  IMPRESSION:  1.  No evidence of reversible ischemia. 2.  Fixed a decreased perfusion in the inferior wall and septum compatible with prior infarct. 3.  Calculated ejection fraction 52%.   Original Report Authenticated By: Andreas Newport, M.D.     Cardiac Studies:  Assessment/Plan:  Acute cholecystitis Status post Recurrent chest pain MI rule out, negative nuclear stress test  Coronary artery disease history of and inferior posterior wall MI status post PCI 100% occluded RCA in September of 2013  Hypertension  History of V. fib arrest in the past  Hypercholesteremia  COPD  History of tobacco abuse  Positive family history of coronary artery disease  Degenerative joint disease  Marked leukocytosis improved Plan Continue present management Hold Brilinta for now as patient had second generation drug eluting stents more than 7 months ago. If patient goes for laparoscopic cholecystectomy next 48 hours will not start on Integrilin and restart Brilinta after the surgery. If patient scheduled for surgery after 2-3 days then we'll start short-acting IIb IIIa inhibitors. will discuss with surgery  LOS: 3 days    Glynna Failla N 12/30/2012, 8:55 AM

## 2012-12-31 ENCOUNTER — Encounter (HOSPITAL_COMMUNITY): Payer: Self-pay | Admitting: Certified Registered"

## 2012-12-31 ENCOUNTER — Inpatient Hospital Stay (HOSPITAL_COMMUNITY): Payer: Self-pay | Admitting: Anesthesiology

## 2012-12-31 ENCOUNTER — Encounter (HOSPITAL_COMMUNITY): Payer: Self-pay | Admitting: Anesthesiology

## 2012-12-31 ENCOUNTER — Encounter (HOSPITAL_COMMUNITY)
Admission: EM | Disposition: A | Discharge status: Home / Self Care | Payer: Self-pay | Source: Home / Self Care | Attending: Cardiology

## 2012-12-31 HISTORY — PX: CHOLECYSTECTOMY: SHX55

## 2012-12-31 LAB — CBC
HCT: 35.2 % — ABNORMAL LOW (ref 36.0–46.0)
MCHC: 34.7 g/dL (ref 30.0–36.0)
MCV: 89.8 fL (ref 78.0–100.0)
Platelets: 209 10*3/uL (ref 150–400)
RDW: 14.9 % (ref 11.5–15.5)

## 2012-12-31 SURGERY — LAPAROSCOPIC CHOLECYSTECTOMY
Anesthesia: General | Site: Abdomen | Wound class: Clean Contaminated

## 2012-12-31 MED ORDER — MEPERIDINE HCL 25 MG/ML IJ SOLN: 6.2500 mg | INTRAMUSCULAR | Status: DC | PRN

## 2012-12-31 MED ORDER — ONDANSETRON HCL 4 MG/2ML IJ SOLN
INTRAMUSCULAR | Status: AC
  Filled 2012-12-31: qty 2

## 2012-12-31 MED ORDER — HYDROMORPHONE HCL PF 1 MG/ML IJ SOLN
0.5000 mg | Freq: Once | INTRAMUSCULAR | Status: AC
  Administered 2012-12-31: 0.5 mg via INTRAVENOUS

## 2012-12-31 MED ORDER — NEOSTIGMINE METHYLSULFATE 1 MG/ML IJ SOLN
INTRAMUSCULAR | Status: DC | PRN
  Administered 2012-12-31: 4 mg via INTRAVENOUS

## 2012-12-31 MED ORDER — BUPIVACAINE-EPINEPHRINE PF 0.25-1:200000 % IJ SOLN
INTRAMUSCULAR | Status: AC
  Filled 2012-12-31: qty 30

## 2012-12-31 MED ORDER — HYDROMORPHONE HCL PF 1 MG/ML IJ SOLN
INTRAMUSCULAR | Status: AC
  Filled 2012-12-31: qty 1

## 2012-12-31 MED ORDER — MORPHINE SULFATE 4 MG/ML IJ SOLN
4.0000 mg | INTRAMUSCULAR | Status: DC | PRN
  Administered 2012-12-31 – 2013-01-01 (×2): 4 mg via INTRAVENOUS
  Filled 2012-12-31 (×2): qty 1

## 2012-12-31 MED ORDER — LACTATED RINGERS IV SOLN
INTRAVENOUS | Status: DC
  Administered 2012-12-31: 12:00:00 via INTRAVENOUS

## 2012-12-31 MED ORDER — OXYCODONE HCL 5 MG PO TABS: 5.0000 mg | ORAL_TABLET | Freq: Once | ORAL | Status: DC | PRN

## 2012-12-31 MED ORDER — PROPOFOL 10 MG/ML IV BOLUS
INTRAVENOUS | Status: DC | PRN
  Administered 2012-12-31: 100 mg via INTRAVENOUS

## 2012-12-31 MED ORDER — OXYCODONE-ACETAMINOPHEN 5-325 MG PO TABS
1.0000 | ORAL_TABLET | ORAL | Status: DC | PRN
  Administered 2012-12-31 (×2): 1 via ORAL
  Administered 2012-12-31: 2 via ORAL
  Filled 2012-12-31: qty 1
  Filled 2012-12-31 (×2): qty 2
  Filled 2012-12-31: qty 1

## 2012-12-31 MED ORDER — SUCCINYLCHOLINE CHLORIDE 20 MG/ML IJ SOLN
INTRAMUSCULAR | Status: DC | PRN
  Administered 2012-12-31: 120 mg via INTRAVENOUS

## 2012-12-31 MED ORDER — ONDANSETRON HCL 4 MG/2ML IJ SOLN
4.0000 mg | Freq: Once | INTRAMUSCULAR | Status: AC | PRN
  Administered 2012-12-31: 4 mg via INTRAVENOUS

## 2012-12-31 MED ORDER — 0.9 % SODIUM CHLORIDE (POUR BTL) OPTIME
TOPICAL | Status: DC | PRN
  Administered 2012-12-31: 1000 mL

## 2012-12-31 MED ORDER — OXYCODONE HCL 5 MG/5ML PO SOLN: 5.0000 mg | Freq: Once | ORAL | Status: DC | PRN

## 2012-12-31 MED ORDER — FENTANYL CITRATE 0.05 MG/ML IJ SOLN
INTRAMUSCULAR | Status: DC | PRN
  Administered 2012-12-31: 100 ug via INTRAVENOUS
  Administered 2012-12-31: 150 ug via INTRAVENOUS

## 2012-12-31 MED ORDER — ROCURONIUM BROMIDE 100 MG/10ML IV SOLN
INTRAVENOUS | Status: DC | PRN
  Administered 2012-12-31: 20 mg via INTRAVENOUS

## 2012-12-31 MED ORDER — GLYCOPYRROLATE 0.2 MG/ML IJ SOLN
INTRAMUSCULAR | Status: DC | PRN
  Administered 2012-12-31: .8 mg via INTRAVENOUS

## 2012-12-31 MED ORDER — SODIUM CHLORIDE 0.9 % IR SOLN
Status: DC | PRN
  Administered 2012-12-31: 1000 mL

## 2012-12-31 MED ORDER — HYDROMORPHONE HCL PF 1 MG/ML IJ SOLN
0.2500 mg | INTRAMUSCULAR | Status: DC | PRN
  Administered 2012-12-31 (×4): 0.5 mg via INTRAVENOUS

## 2012-12-31 MED ORDER — ONDANSETRON HCL 4 MG/2ML IJ SOLN
4.0000 mg | Freq: Four times a day (QID) | INTRAMUSCULAR | Status: DC | PRN
  Administered 2013-01-01: 4 mg via INTRAVENOUS
  Filled 2012-12-31: qty 2

## 2012-12-31 MED ORDER — MIDAZOLAM HCL 5 MG/5ML IJ SOLN
INTRAMUSCULAR | Status: DC | PRN
  Administered 2012-12-31: 2 mg via INTRAVENOUS

## 2012-12-31 MED ORDER — LABETALOL HCL 5 MG/ML IV SOLN
INTRAVENOUS | Status: DC | PRN
  Administered 2012-12-31: 5 mg via INTRAVENOUS

## 2012-12-31 MED ORDER — LACTATED RINGERS IV SOLN
INTRAVENOUS | Status: DC | PRN
  Administered 2012-12-31: 13:00:00 via INTRAVENOUS

## 2012-12-31 MED ORDER — ONDANSETRON HCL 4 MG/2ML IJ SOLN
INTRAMUSCULAR | Status: DC | PRN
  Administered 2012-12-31 (×2): 4 mg via INTRAVENOUS

## 2012-12-31 MED ORDER — BUPIVACAINE-EPINEPHRINE 0.25% -1:200000 IJ SOLN
INTRAMUSCULAR | Status: DC | PRN
  Administered 2012-12-31: 20 mL

## 2012-12-31 MED ORDER — LIDOCAINE HCL (CARDIAC) 20 MG/ML IV SOLN
INTRAVENOUS | Status: DC | PRN
  Administered 2012-12-31: 60 mg via INTRAVENOUS

## 2012-12-31 SURGICAL SUPPLY — 36 items
APL SKNCLS STERI-STRIP NONHPOA (GAUZE/BANDAGES/DRESSINGS) ×4
APPLIER CLIP 5 13 M/L LIGAMAX5 (MISCELLANEOUS) ×6
APR CLP MED LRG 5 ANG JAW (MISCELLANEOUS) ×4
BAG SPEC RTRVL LRG 6X4 10 (ENDOMECHANICALS) ×4
BANDAGE ADHESIVE 1X3 (GAUZE/BANDAGES/DRESSINGS) ×12 IMPLANT
BENZOIN TINCTURE PRP APPL 2/3 (GAUZE/BANDAGES/DRESSINGS) ×5 IMPLANT
CANISTER SUCTION 2500CC (MISCELLANEOUS) ×3 IMPLANT
CHLORAPREP W/TINT 26ML (MISCELLANEOUS) ×3 IMPLANT
CLIP APPLIE 5 13 M/L LIGAMAX5 (MISCELLANEOUS) ×3 IMPLANT
CLOTH BEACON ORANGE TIMEOUT ST (SAFETY) ×5 IMPLANT
COVER MAYO STAND STRL (DRAPES) ×2 IMPLANT
COVER SURGICAL LIGHT HANDLE (MISCELLANEOUS) ×5 IMPLANT
DECANTER SPIKE VIAL GLASS SM (MISCELLANEOUS) ×3 IMPLANT
DRAPE C-ARM 42X72 X-RAY (DRAPES) IMPLANT
ELECT REM PT RETURN 9FT ADLT (ELECTROSURGICAL) ×3
ELECTRODE REM PT RTRN 9FT ADLT (ELECTROSURGICAL) ×2 IMPLANT
GLOVE SURG SIGNA 7.5 PF LTX (GLOVE) ×3 IMPLANT
GOWN PREVENTION PLUS XLARGE (GOWN DISPOSABLE) ×3 IMPLANT
GOWN STRL NON-REIN LRG LVL3 (GOWN DISPOSABLE) ×9 IMPLANT
KIT BASIN OR (CUSTOM PROCEDURE TRAY) ×5 IMPLANT
KIT ROOM TURNOVER OR (KITS) ×3 IMPLANT
NS IRRIG 1000ML POUR BTL (IV SOLUTION) ×3 IMPLANT
PAD ARMBOARD 7.5X6 YLW CONV (MISCELLANEOUS) ×3 IMPLANT
POUCH SPECIMEN RETRIEVAL 10MM (ENDOMECHANICALS) ×4 IMPLANT
SCISSORS LAP 5X35 DISP (ENDOMECHANICALS) IMPLANT
SET CHOLANGIOGRAPH 5 50 .035 (SET/KITS/TRAYS/PACK) IMPLANT
SET IRRIG TUBING LAPAROSCOPIC (IRRIGATION / IRRIGATOR) ×3 IMPLANT
SLEEVE ENDOPATH XCEL 5M (ENDOMECHANICALS) ×10 IMPLANT
SPECIMEN JAR SMALL (MISCELLANEOUS) ×3 IMPLANT
STRIP CLOSURE SKIN 1/2X4 (GAUZE/BANDAGES/DRESSINGS) ×2 IMPLANT
SUT MON AB 4-0 PC3 18 (SUTURE) ×7 IMPLANT
TOWEL OR 17X24 6PK STRL BLUE (TOWEL DISPOSABLE) ×3 IMPLANT
TOWEL OR 17X26 10 PK STRL BLUE (TOWEL DISPOSABLE) ×3 IMPLANT
TRAY LAPAROSCOPIC (CUSTOM PROCEDURE TRAY) ×3 IMPLANT
TROCAR XCEL BLUNT TIP 100MML (ENDOMECHANICALS) ×7 IMPLANT
TROCAR XCEL NON-BLD 5MMX100MML (ENDOMECHANICALS) ×5 IMPLANT

## 2012-12-31 NOTE — Anesthesia Postprocedure Evaluation (Signed)
Anesthesia Post Note  Patient: [name]  Procedure(s) Performed: Procedure(s) (LRB): LAPAROSCOPIC CHOLECYSTECTOMY (N/A)  Anesthesia type: general  Patient location: PACU  Post pain: Pain level controlled  Post assessment: Patient's Cardiovascular Status Stable  Last Vitals:  Filed Vitals:   12/31/12 1515  BP:   Pulse: 84  Temp:   Resp: 17    Post vital signs: Reviewed and stable  Level of consciousness: sedated  Complications: No apparent anesthesia complications

## 2012-12-31 NOTE — Anesthesia Preprocedure Evaluation (Addendum)
Anesthesia Evaluation  Patient identified by MRN, date of birth, ID band Patient awake    Reviewed: Allergy & Precautions, H&P , NPO status , Patient's Chart, lab work & pertinent test results  Airway Mallampati: I TM Distance: >3 FB Neck ROM: Full    Dental  (+) Dental Advisory Given and Teeth Intact,    Pulmonary shortness of breath, with exertion and lying,  breath sounds clear to auscultation        Cardiovascular hypertension, Pt. on medications + Past MI and + Cardiac Stents Rhythm:Regular Rate:Normal     Neuro/Psych negative neurological ROS  negative psych ROS   GI/Hepatic Neg liver ROS, hiatal hernia, GERD-  Medicated and Controlled,  Endo/Other    Renal/GU negative Renal ROS     Musculoskeletal negative musculoskeletal ROS (+)   Abdominal (+)  Abdomen: soft. Bowel sounds: normal.  Peds  Hematology negative hematology ROS (+)   Anesthesia Other Findings   Reproductive/Obstetrics negative OB ROS                         Anesthesia Physical Anesthesia Plan  ASA: III  Anesthesia Plan: General   Post-op Pain Management:    Induction: Intravenous  Airway Management Planned: Oral ETT  Additional Equipment:   Intra-op Plan:   Post-operative Plan: Extubation in OR  Informed Consent: I have reviewed the patients History and Physical, chart, labs and discussed the procedure including the risks, benefits and alternatives for the proposed anesthesia with the patient or authorized representative who has indicated his/her understanding and acceptance.     Plan Discussed with: CRNA and Surgeon  Anesthesia Plan Comments:         Anesthesia Quick Evaluation

## 2012-12-31 NOTE — Transfer of Care (Addendum)
Immediate Anesthesia Transfer of Care Note  Patient: [name]  Procedure(s) Performed: Procedure(s): LAPAROSCOPIC CHOLECYSTECTOMY (N/A)  Patient Location: PACU  Anesthesia Type:General  Level of Consciousness: awake, alert  and oriented  Airway & Oxygen Therapy: Patient connected to face mask  Post-op Assessment: Report given to PACU RN  Post vital signs: stable  Complications: No apparent anesthesia complications and pt nauseous and 7/10 pain.  Zofran and pain med administered in PACU will f/u.

## 2012-12-31 NOTE — Progress Notes (Signed)
Subjective:  Patient denies any chest pain or shortness of breath. Abdominal pain slightly improved white count normalized. Remains afebrile. Patient is off branch of the last 48 hours. Discussed with patient regarding slight increase risk of bleeding as patient off the vent of only 2 days. Acceptable risk for laparoscopic cholecystectomy from cardiac point of view.  Objective:  Vital Signs in the last 24 hours: Temp:  [97.8 F (36.6 C)-98.6 F (37 C)] 97.8 F (36.6 C) (04/28 0800) Pulse Rate:  [66-105] 74 (04/28 1000) Resp:  [14-30] 16 (04/28 0900) BP: (84-145)/(44-88) 102/63 mmHg (04/28 1000) SpO2:  [88 %-96 %] 93 % (04/28 0900) Weight:  [84.5 kg (186 lb 4.6 oz)] 84.5 kg (186 lb 4.6 oz) (04/28 0536)  Intake/Output from previous day: 04/27 0701 - 04/28 0700 In: 1940 [P.O.:240; I.V.:1200; IV Piggyback:500] Out: -  Intake/Output from this shift: Total I/O In: 200 [I.V.:150; IV Piggyback:50] Out: -   Physical Exam: Neck: no adenopathy, no carotid bruit, no JVD and supple, symmetrical, trachea midline Lungs: clear to auscultation bilaterally Heart: regular rate and rhythm, S1, S2 normal, no murmur, click, rub or gallop Abdomen: Soft bowel sounds present mild right upper quadrant tenderness no guarding Extremities: extremities normal, atraumatic, no cyanosis or edema  Lab Results:  Recent Labs  12/30/12 0600 12/31/12 0640  WBC 16.4* 7.9  HGB 13.5 12.2  PLT 225 209    Recent Labs  12/29/12 0525 12/30/12 0600  NA 137 137  K 4.3 3.8  CL 100 102  CO2 26 23  GLUCOSE 136* 121*  BUN 9 13  CREATININE 0.66 0.88    Recent Labs  12/28/12 1205 12/28/12 1518  TROPONINI <0.30 <0.30   Hepatic Function Panel No results found for this basename: PROT, ALBUMIN, AST, ALT, ALKPHOS, BILITOT, BILIDIR, IBILI,  in the last 72 hours No results found for this basename: CHOL,  in the last 72 hours No results found for this basename: PROTIME,  in the last 72 hours  Imaging: Imaging  results have been reviewed and Ct Abdomen Pelvis W Contrast  12/29/2012  *RADIOLOGY REPORT*  Clinical Data: Acute right upper quadrant abdominal pain. Leukocytosis.  Nausea.  CT ABDOMEN AND PELVIS WITH CONTRAST  Technique:  Multidetector CT imaging of the abdomen and pelvis was performed following the standard protocol during bolus administration of intravenous contrast.  Contrast: OMNIPAQUE IOHEXOL 300 MG/ML  SOLN  Comparison: CT of the abdomen and pelvis 03/26/2008.  Findings:  Lung Bases: Linear opacities in the lower lobes of the lungs bilaterally most compatible with areas of subsegmental atelectasis and/or scarring.  Within the visualized portions of the right breast there are several areas of soft tissue prominence adjacent to the breast prosthesis, however, these appear very similar to remote prior study from 03/26/2008, and are therefore likely benign.  Atherosclerotic calcifications in the left main, left anterior descending and right coronary arteries.  Small hiatal hernia.  Abdomen/Pelvis:  The gallbladder is moderately distended, and is remarkable for some wall thickening (up to 10 mm) as well as a small amount of pericholecystic fluid and stranding.  No definite radiopaque gallstones are identified.  Common bile duct does not appear dilated, nor does the pancreatic duct.  No focal cystic or solid hepatic lesions.  The appearance of the pancreas, spleen, bilateral adrenal glands and bilateral kidneys is unremarkable. Atherosclerosis throughout the abdominal and pelvic vasculature, without definite aneurysm or dissection.  There is a trace amount of fluid tracking inferiorly from the gallbladder fossa into the  right pericolic gutter.  No larger volume of ascites.  No pneumoperitoneum.  No pathologic distension of small bowel.  There are several colonic diverticula, predominantly in the region of the sigmoid colon, without surrounding inflammatory changes to suggest acute diverticulitis at this  time.  Uterus and ovaries are unremarkable in appearance. Urinary bladder is decompressed, but otherwise unremarkable.  Musculoskeletal: There are no aggressive appearing lytic or blastic lesions noted in the visualized portions of the skeleton.  IMPRESSION: 1.  Findings, as above, concerning for acute cholecystitis. 2.  Atelectasis and/or scarring in the lung bases bilaterally. 3.  Colonic diverticulosis without evidence to suggest acute diverticulitis at this time. 4.  Small hiatal hernia.  5. Atherosclerosis, including left main and two-vessel coronary artery disease. Please note that although the presence of coronary artery calcium documents the presence of coronary artery disease, the severity of this disease and any potential stenosis cannot be assessed on this non-gated CT examination.  Assessment for potential risk factor modification, dietary therapy or pharmacologic therapy may be warranted, if clinically indicated.   Original Report Authenticated By: Trudie Reed, M.D.     Cardiac Studies:  Assessment/Plan:  Resolving Acute cholecystitis  Status post Recurrent chest pain MI rule out, negative nuclear stress test  Coronary artery disease history of and inferior posterior wall MI status post PCI 100% occluded RCA in September of 2013  Hypertension  History of V. fib arrest in the past  Hypercholesteremia  COPD  History of tobacco abuse  Positive family history of coronary artery disease  Degenerative joint disease  Plan Continue present management Acceptable risk for laparoscopic cholecystectomy   LOS: 4 days    Holly Pring N 12/31/2012, 11:03 AM

## 2012-12-31 NOTE — Preoperative (Signed)
Beta Blockers   Reason not to administer Beta Blockers:Not Applicable 

## 2012-12-31 NOTE — Progress Notes (Signed)
Subjective: Still with RUQ abdominal pain No chest pain or SOB Slightly hypotensive this morning  Objective: Vital signs in last 24 hours: Temp:  [97.8 F (36.6 C)-98.6 F (37 C)] 97.8 F (36.6 C) (04/28 0800) Pulse Rate:  [66-105] 71 (04/28 0800) Resp:  [14-30] 15 (04/28 0800) BP: (76-145)/(44-88) 90/53 mmHg (04/28 0800) SpO2:  [88 %-96 %] 95 % (04/28 0800) Weight:  [186 lb 4.6 oz (84.5 kg)] 186 lb 4.6 oz (84.5 kg) (04/28 0536) Last BM Date: 12/26/12  Intake/Output from previous day: 04/27 0701 - 04/28 0700 In: 1940 [P.O.:240; I.V.:1200; IV Piggyback:500] Out: -  Intake/Output this shift: Total I/O In: 50 [I.V.:50] Out: -   Abdomen soft with mild to moderate tenderness and guarding in RUQ  Lab Results:   Recent Labs  12/30/12 0600 12/31/12 0640  WBC 16.4* 7.9  HGB 13.5 12.2  HCT 39.1 35.2*  PLT 225 209   BMET  Recent Labs  12/29/12 0525 12/30/12 0600  NA 137 137  K 4.3 3.8  CL 100 102  CO2 26 23  GLUCOSE 136* 121*  BUN 9 13  CREATININE 0.66 0.88  CALCIUM 9.8 8.9   PT/INR No results found for this basename: LABPROT, INR,  in the last 72 hours ABG No results found for this basename: PHART, PCO2, PO2, HCO3,  in the last 72 hours  Studies/Results: Ct Abdomen Pelvis W Contrast  12/29/2012  *RADIOLOGY REPORT*  Clinical Data: Acute right upper quadrant abdominal pain. Leukocytosis.  Nausea.  CT ABDOMEN AND PELVIS WITH CONTRAST  Technique:  Multidetector CT imaging of the abdomen and pelvis was performed following the standard protocol during bolus administration of intravenous contrast.  Contrast: OMNIPAQUE IOHEXOL 300 MG/ML  SOLN  Comparison: CT of the abdomen and pelvis 03/26/2008.  Findings:  Lung Bases: Linear opacities in the lower lobes of the lungs bilaterally most compatible with areas of subsegmental atelectasis and/or scarring.  Within the visualized portions of the right breast there are several areas of soft tissue prominence adjacent to  the breast prosthesis, however, these appear very similar to remote prior study from 03/26/2008, and are therefore likely benign.  Atherosclerotic calcifications in the left main, left anterior descending and right coronary arteries.  Small hiatal hernia.  Abdomen/Pelvis:  The gallbladder is moderately distended, and is remarkable for some wall thickening (up to 10 mm) as well as a small amount of pericholecystic fluid and stranding.  No definite radiopaque gallstones are identified.  Common bile duct does not appear dilated, nor does the pancreatic duct.  No focal cystic or solid hepatic lesions.  The appearance of the pancreas, spleen, bilateral adrenal glands and bilateral kidneys is unremarkable. Atherosclerosis throughout the abdominal and pelvic vasculature, without definite aneurysm or dissection.  There is a trace amount of fluid tracking inferiorly from the gallbladder fossa into the right pericolic gutter.  No larger volume of ascites.  No pneumoperitoneum.  No pathologic distension of small bowel.  There are several colonic diverticula, predominantly in the region of the sigmoid colon, without surrounding inflammatory changes to suggest acute diverticulitis at this time.  Uterus and ovaries are unremarkable in appearance. Urinary bladder is decompressed, but otherwise unremarkable.  Musculoskeletal: There are no aggressive appearing lytic or blastic lesions noted in the visualized portions of the skeleton.  IMPRESSION: 1.  Findings, as above, concerning for acute cholecystitis. 2.  Atelectasis and/or scarring in the lung bases bilaterally. 3.  Colonic diverticulosis without evidence to suggest acute diverticulitis at this time. 4.  Small hiatal hernia.  5. Atherosclerosis, including left main and two-vessel coronary artery disease. Please note that although the presence of coronary artery calcium documents the presence of coronary artery disease, the severity of this disease and any potential stenosis  cannot be assessed on this non-gated CT examination.  Assessment for potential risk factor modification, dietary therapy or pharmacologic therapy may be warranted, if clinically indicated.   Original Report Authenticated By: Trudie Reed, M.D.     Anti-infectives: Anti-infectives   Start     Dose/Rate Route Frequency Ordered Stop   12/29/12 1200  metroNIDAZOLE (FLAGYL) IVPB 500 mg     500 mg 100 mL/hr over 60 Minutes Intravenous 4 times per day 12/29/12 1021     12/28/12 2200  cefTRIAXone (ROCEPHIN) 1 g in dextrose 5 % 50 mL IVPB     1 g 100 mL/hr over 30 Minutes Intravenous Every 12 hours 12/28/12 1523     12/28/12 0800  cefTRIAXone (ROCEPHIN) 1 g in dextrose 5 % 50 mL IVPB  Status:  Discontinued     1 g 100 mL/hr over 30 Minutes Intravenous Every 24 hours 12/28/12 0754 12/28/12 1523   12/28/12 0800  azithromycin (ZITHROMAX) 500 mg in dextrose 5 % 250 mL IVPB  Status:  Discontinued     500 mg 250 mL/hr over 60 Minutes Intravenous Every 24 hours 12/28/12 0754 12/28/12 1548      Assessment/Plan: s/p * No surgery found *  Acute cholecystitis  Plan lap chole today if OK with cardiology.  I discussed this with her in detail.  I discussed the laparoscopic approach as well as the risks.  These include but are not limited to bleeding, infection, bile duct injury, bile leak, the need to convert to an open procedure, cardiopulmonary problems, etc.   LOS: 4 days    Sumiya Mamaril A 12/31/2012

## 2012-12-31 NOTE — Progress Notes (Signed)
Patient ID: [name], female   DOB: 1951/03/09, 62 y.o.   MRN: [id number] [facility] with cardiology so will proceed with lap chole

## 2012-12-31 NOTE — Op Note (Signed)
Laparoscopic Cholecystectomy Procedure Note  Indications: This patient presents with symptomatic gallbladder disease and will undergo laparoscopic cholecystectomy.  Pre-operative Diagnosis: Calculus of gallbladder with acute cholecystitis, without mention of obstruction  Post-operative Diagnosis: Same  Surgeon: Abigail Miyamoto A   Assistants: 0  Anesthesia: General endotracheal anesthesia  ASA Class: 3  Procedure Details  The patient was seen again in the Holding Room. The risks, benefits, complications, treatment options, and expected outcomes were discussed with the patient. The possibilities of reaction to medication, pulmonary aspiration, perforation of viscus, bleeding, recurrent infection, finding a normal gallbladder, the need for additional procedures, failure to diagnose a condition, the possible need to convert to an open procedure, and creating a complication requiring transfusion or operation were discussed with the patient. The likelihood of improving the patient's symptoms with return to their baseline status is good.  The patient and/or family concurred with the proposed plan, giving informed consent. The site of surgery properly noted. The patient was taken to Operating Room, identified as [name] and the procedure verified as Laparoscopic Cholecystectomy with Intraoperative Cholangiogram. A Time Out was held and the above information confirmed.  Prior to the induction of general anesthesia, antibiotic prophylaxis was administered. General endotracheal anesthesia was then administered and tolerated well. After the induction, the abdomen was prepped with Chloraprep and draped in sterile fashion. The patient was positioned in the supine position.  Local anesthetic agent was injected into the skin near the umbilicus and an incision made. We dissected down to the abdominal fascia with blunt dissection.  The fascia was incised vertically and we entered the peritoneal cavity  bluntly.  A pursestring suture of 0-Vicryl was placed around the fascial opening.  The Hasson cannula was inserted and secured with the stay suture.  Pneumoperitoneum was then created with CO2 and tolerated well without any adverse changes in the patient's vital signs. An 11-mm port was placed in the subxiphoid position.  Two 5-mm ports were placed in the right upper quadrant. All skin incisions were infiltrated with a local anesthetic agent before making the incision and placing the trocars.   We positioned the patient in reverse Trendelenburg, tilted slightly to the patient's left.  The gallbladder was identified, the fundus grasped and retracted cephalad. Adhesions were lysed bluntly and with the electrocautery where indicated, taking care not to injure any adjacent organs or viscus. The infundibulum was grasped and retracted laterally, exposing the peritoneum overlying the triangle of Calot. This was then divided and exposed in a blunt fashion. The cystic duct was clearly identified and bluntly dissected circumferentially. A critical view of the cystic duct and cystic artery was obtained.  The cystic duct was then ligated with clips and divided. The cystic artery was, dissected free, ligated with clips and divided as well.   The gallbladder was dissected from the liver bed in retrograde fashion with the electrocautery. The gallbladder was removed and placed in an Endocatch sac. The liver bed was irrigated and inspected. Hemostasis was achieved with the electrocautery. Copious irrigation was utilized and was repeatedly aspirated until clear.  The gallbladder and Endocatch sac were then removed through the umbilical port site.  The pursestring suture was used to close the umbilical fascia.    We again inspected the right upper quadrant for hemostasis.  Pneumoperitoneum was released as we removed the trocars.  4-0 Monocryl was used to close the skin.   Benzoin, steri-strips, and clean dressings were applied.  The patient was then extubated and brought to the recovery  room in stable condition. Instrument, sponge, and needle counts were correct at closure and at the conclusion of the case.   Findings: Cholecystitis with Cholelithiasis  Estimated Blood Loss: Minimal         Drains: 0         Specimens: Gallbladder           Complications: None; patient tolerated the procedure well.         Disposition: PACU - hemodynamically stable.         Condition: stable

## 2012-12-31 NOTE — Progress Notes (Signed)
Flagyl tubed to OR. SCDs on, CHG bath completed. Consent signed in chart. Family at bedside. Molley Houser L

## 2012-12-31 NOTE — Anesthesia Procedure Notes (Signed)
Procedure Name: Intubation Date/Time: 12/31/2012 1:01 PM Performed by: Ellin Goodie Pre-anesthesia Checklist: Patient identified, Emergency Drugs available, Suction available, Patient being monitored and Timeout performed Patient Re-evaluated:Patient Re-evaluated prior to inductionOxygen Delivery Method: Circle system utilized Preoxygenation: Pre-oxygenation with 100% oxygen Intubation Type: IV induction, Rapid sequence and Cricoid Pressure applied Laryngoscope Size: Mac and 3 Grade View: Grade II Tube type: Oral Tube size: 7.5 mm Number of attempts: 1 Airway Equipment and Method: Stylet Placement Confirmation: ETT inserted through vocal cords under direct vision,  positive ETCO2 and breath sounds checked- equal and bilateral Secured at: 22 cm Tube secured with: Tape Dental Injury: Teeth and Oropharynx as per pre-operative assessment  Comments: Easy atraumatic intubation with MAC 3 blade, RSI as pt. Was nauseous in holding room prior to transport to OR.  Dr. Michelle Piper verified placement.  Carlynn Herald, CRNA

## 2012-12-31 NOTE — Progress Notes (Signed)
Spoke with MD Harwani, says to hold all PO meds for surgery. Will cont to monitor pt. Dorin Stooksbury L

## 2013-01-01 ENCOUNTER — Encounter (HOSPITAL_COMMUNITY): Payer: Self-pay | Admitting: Surgery

## 2013-01-01 LAB — COMPREHENSIVE METABOLIC PANEL
BUN: 10 mg/dL (ref 6–23)
Calcium: 8.4 mg/dL (ref 8.4–10.5)
Creatinine, Ser: 0.73 mg/dL (ref 0.50–1.10)
GFR calc Af Amer: >90 mL/min (ref >90)
Glucose, Bld: 133 mg/dL — ABNORMAL HIGH (ref 70–99)
Total Protein: 5.8 g/dL — ABNORMAL LOW (ref 6.0–8.3)

## 2013-01-01 LAB — CBC
HCT: 35 % — ABNORMAL LOW (ref 36.0–46.0)
Hemoglobin: 12.4 g/dL (ref 12.0–15.0)
MCH: 31.5 pg (ref 26.0–34.0)
MCHC: 35.4 g/dL (ref 30.0–36.0)

## 2013-01-01 MED ORDER — HYDROMORPHONE HCL PF 1 MG/ML IJ SOLN
0.5000 mg | INTRAMUSCULAR | Status: DC | PRN
  Administered 2013-01-01: 0.5 mg via INTRAVENOUS
  Filled 2013-01-01: qty 1

## 2013-01-01 MED ORDER — KETOROLAC TROMETHAMINE 30 MG/ML IJ SOLN
30.0000 mg | Freq: Four times a day (QID) | INTRAMUSCULAR | Status: DC | PRN
  Filled 2013-01-01 (×2): qty 1

## 2013-01-01 MED ORDER — TRAMADOL HCL 50 MG PO TABS: 50.0000 mg | ORAL_TABLET | Freq: Four times a day (QID) | ORAL | Status: DC | PRN

## 2013-01-01 MED ORDER — HYDROMORPHONE HCL PF 1 MG/ML IJ SOLN
1.0000 mg | Freq: Three times a day (TID) | INTRAMUSCULAR | Status: DC | PRN
  Administered 2013-01-01: 1 mg via INTRAVENOUS
  Filled 2013-01-01: qty 1

## 2013-01-01 MED ORDER — GI COCKTAIL ~~LOC~~
30.0000 mL | Freq: Once | ORAL | Status: AC
  Administered 2013-01-01: 30 mL via ORAL
  Filled 2013-01-01: qty 30

## 2013-01-01 MED ORDER — GI COCKTAIL ~~LOC~~
30.0000 mL | Freq: Three times a day (TID) | ORAL | Status: DC | PRN
  Filled 2013-01-01: qty 30

## 2013-01-01 NOTE — Progress Notes (Signed)
Subjective:  Patient denies any chest pain or shortness of breath. Tolerated laparoscopic cholecystectomy yesterday had significant abdominal pain last night feels better this morning  Objective:  Vital Signs in the last 24 hours: Temp:  [97.4 F (36.3 C)-98.6 F (37 C)] 98.1 F (36.7 C) (04/29 1207) Pulse Rate:  [62-138] 77 (04/29 1207) Resp:  [12-32] 17 (04/29 1207) BP: (92-142)/(54-98) 92/72 mmHg (04/29 1207) SpO2:  [92 %-99 %] 92 % (04/29 1207)  Intake/Output from previous day: 04/28 0701 - 04/29 0700 In: 1950 [I.V.:1800; IV Piggyback:150] Out: 100 [Blood:100] Intake/Output from this shift: Total I/O In: 440 [P.O.:240; I.V.:150; IV Piggyback:50] Out: -   Physical Exam: Neck: no adenopathy, no carotid bruit, no JVD and supple, symmetrical, trachea midline Lungs: clear to auscultation bilaterally Heart: regular rate and rhythm, S1, S2 normal and Soft systolic murmur noted Abdomen: soft, non-tender; bowel sounds normal; no masses,  no organomegaly Extremities: extremities normal, atraumatic, no cyanosis or edema  Lab Results:  Recent Labs  12/31/12 0640 01/01/13 0440  WBC 7.9 11.7*  HGB 12.2 12.4  PLT 209 256    Recent Labs  12/30/12 0600  NA 137  K 3.8  CL 102  CO2 23  GLUCOSE 121*  BUN 13  CREATININE 0.88   No results found for this basename: TROPONINI, CK, MB,  in the last 72 hours Hepatic Function Panel No results found for this basename: PROT, ALBUMIN, AST, ALT, ALKPHOS, BILITOT, BILIDIR, IBILI,  in the last 72 hours No results found for this basename: CHOL,  in the last 72 hours No results found for this basename: PROTIME,  in the last 72 hours  Imaging: Imaging results have been reviewed and No results found.  Cardiac Studies:  Assessment/Plan:  Status post laparoscopic cholecystectomy postop day 1 Status post Recurrent chest pain MI rule out, negative nuclear stress test  Coronary artery disease history of and inferior posterior wall MI  status post PCI 100% occluded RCA in September of 2013  Hypertension  New-onset diabetes mellitus controlled by diet History of V. fib arrest in the past  Hypercholesteremia  COPD  History of tobacco abuse  Positive family history of coronary artery disease  Degenerative joint disease Plan Continue present management Increase ambulation as tolerated Restart Brilinta 90 mg twice daily if okay with surgery  LOS: 5 days    [name] N 01/01/2013, 12:25 PM

## 2013-01-01 NOTE — Progress Notes (Signed)
Patient ID: [name], female   DOB: Jun 12, 1951, 62 y.o.   MRN: [id number]  Bili up post op.  Was unable to do intraop c-gram.  Patient may have a retained stone.  Will consult GI to consider ERCP.

## 2013-01-01 NOTE — Progress Notes (Signed)
I have seen and examined the patient and agree with the assessment and plans. Repeat CMP  Lillard Bailon A. Magnus Ivan  MD, FACS

## 2013-01-01 NOTE — Progress Notes (Signed)
Patient ID: [name], female   DOB: 1951-06-16, 62 y.o.   MRN: [id number] 1 Day Post-Op  Subjective: Pt with "worse night of my life".  Severe pain all night.  Could not get relief.  Was resistant to taking pain medicine but did finally take it but didn't get relief.  Was mostly in epigastric with radiation across to left and right flank.  Associated with nausea.  Better this am.  Wants to adjust pain medicine to try and avoid narcotics.  Having trouble voiding and having to strain.  Had to have GI cocktail last night and wants to have that again.  Objective: Vital signs in last 24 hours: Temp:  [97.4 F (36.3 C)-98.6 F (37 C)] 98 F (36.7 C) (04/29 0755) Pulse Rate:  [62-138] 84 (04/29 0755) Resp:  [12-32] 20 (04/29 0755) BP: (102-142)/(54-98) 102/60 mmHg (04/29 0755) SpO2:  [92 %-99 %] 97 % (04/29 0755) Last BM Date: 12/30/12  Intake/Output from previous day: 04/28 0701 - 04/29 0700 In: 1950 [I.V.:1800; IV Piggyback:150] Out: 100 [Blood:100] Intake/Output this shift:    PE: Abd: soft, tender in epigastric but no peritonitis, +bs, incisions c/d/i, not distended. General: NAD, appears in discomfort but not really in severe pain  Lab Results:   Recent Labs  12/31/12 0640 01/01/13 0440  WBC 7.9 11.7*  HGB 12.2 12.4  HCT 35.2* 35.0*  PLT 209 256   BMET  Recent Labs  12/30/12 0600  NA 137  K 3.8  CL 102  CO2 23  GLUCOSE 121*  BUN 13  CREATININE 0.88  CALCIUM 8.9   PT/INR No results found for this basename: LABPROT, INR,  in the last 72 hours CMP     Component Value Date/Time   NA 137 12/30/2012 0600   K 3.8 12/30/2012 0600   CL 102 12/30/2012 0600   CO2 23 12/30/2012 0600   GLUCOSE 121* 12/30/2012 0600   BUN 13 12/30/2012 0600   CREATININE 0.88 12/30/2012 0600   CREATININE 0.66 02/08/2012 0947   CALCIUM 8.9 12/30/2012 0600   PROT 7.4 12/28/2012 0310   ALBUMIN 4.0 12/28/2012 0310   AST 20 12/28/2012 0310   ALT 19 12/28/2012 0310   ALKPHOS 148* 12/28/2012 0310    BILITOT 0.2* 12/28/2012 0310   GFRNONAA 68* 12/30/2012 0600   GFRAA 79* 12/30/2012 0600   Lipase     Component Value Date/Time   LIPASE 14 12/29/2012 0525       Studies/Results: No results found.  Anti-infectives: Anti-infectives   Start     Dose/Rate Route Frequency Ordered Stop   12/29/12 1200  metroNIDAZOLE (FLAGYL) IVPB 500 mg     500 mg 100 mL/hr over 60 Minutes Intravenous 4 times per day 12/29/12 1021     12/28/12 2200  cefTRIAXone (ROCEPHIN) 1 g in dextrose 5 % 50 mL IVPB     1 g 100 mL/hr over 30 Minutes Intravenous Every 12 hours 12/28/12 1523     12/28/12 0800  cefTRIAXone (ROCEPHIN) 1 g in dextrose 5 % 50 mL IVPB  Status:  Discontinued     1 g 100 mL/hr over 30 Minutes Intravenous Every 24 hours 12/28/12 0754 12/28/12 1523   12/28/12 0800  azithromycin (ZITHROMAX) 500 mg in dextrose 5 % 250 mL IVPB  Status:  Discontinued     500 mg 250 mL/hr over 60 Minutes Intravenous Every 24 hours 12/28/12 0754 12/28/12 1548       Assessment/Plan 1. POD#1-lap chol: severe pain last night but  looking over medication last night she really didn't receive very much at all, appears to be just lack of adequate control, have compromised with patient and have ordered dilaudid .5-1mg  q2hrs and ultram 50mg  q6hrs prn.  Ask Cardiology about Toradol but they did not apparently want to put her on this but will speak with them again about this.  Will advance diet as tolerated today.  Will bladder scan patient and if >500cc then in and out cath only once.  If persist then patient will need foley.  Out of bed as tolerated.     LOS: 5 days    Devaney Segers 01/01/2013

## 2013-01-02 LAB — COMPREHENSIVE METABOLIC PANEL
Albumin: 2.4 g/dL — ABNORMAL LOW (ref 3.5–5.2)
Alkaline Phosphatase: 369 U/L — ABNORMAL HIGH (ref 39–117)
BUN: 8 mg/dL (ref 6–23)
Chloride: 103 mEq/L (ref 96–112)
Glucose, Bld: 111 mg/dL — ABNORMAL HIGH (ref 70–99)
Potassium: 3.5 mEq/L (ref 3.5–5.1)
Total Bilirubin: 1.6 mg/dL — ABNORMAL HIGH (ref 0.3–1.2)

## 2013-01-02 LAB — CBC
HCT: 31.4 % — ABNORMAL LOW (ref 36.0–46.0)
Hemoglobin: 11 g/dL — ABNORMAL LOW (ref 12.0–15.0)
WBC: 10.3 10*3/uL (ref 4.0–10.5)

## 2013-01-02 MED ORDER — LOPERAMIDE HCL 2 MG PO CAPS: 2.0000 mg | ORAL_CAPSULE | ORAL | Status: DC | PRN

## 2013-01-02 MED ORDER — TICAGRELOR 90 MG PO TABS
90.0000 mg | ORAL_TABLET | Freq: Two times a day (BID) | ORAL | Status: DC
  Administered 2013-01-02 – 2013-01-04 (×5): 90 mg via ORAL
  Filled 2013-01-02 (×7): qty 1

## 2013-01-02 MED ORDER — PANTOPRAZOLE SODIUM 40 MG PO TBEC
40.0000 mg | DELAYED_RELEASE_TABLET | Freq: Every day | ORAL | Status: DC
  Administered 2013-01-03 – 2013-01-04 (×2): 40 mg via ORAL
  Filled 2013-01-02 (×2): qty 1

## 2013-01-02 NOTE — Progress Notes (Signed)
Subjective:  Patient denies any chest pain states abdominal pain has improved  Objective:  Vital Signs in the last 24 hours: Temp:  [97.3 F (36.3 C)-98.7 F (37.1 C)] 97.9 F (36.6 C) (04/30 1118) Pulse Rate:  [71-116] 74 (04/30 1118) Resp:  [19-30] 20 (04/30 1118) BP: (87-127)/(55-95) 89/69 mmHg (04/30 1118) SpO2:  [90 %-97 %] 94 % (04/30 1118) Weight:  [90 kg (198 lb 6.6 oz)] 90 kg (198 lb 6.6 oz) (04/30 0358)  Intake/Output from previous day: 04/29 0701 - 04/30 0700 In: 1860 [P.O.:360; I.V.:1000; IV Piggyback:500] Out: 500 [Urine:500] Intake/Output from this shift:    Physical Exam: Neck: no adenopathy, no carotid bruit, no JVD and supple, symmetrical, trachea midline Lungs: clear to auscultation bilaterally Heart: regular rate and rhythm, S1, S2 normal, no murmur, click, rub or gallop Abdomen: soft, non-tender; bowel sounds normal; no masses,  no organomegaly Extremities: extremities normal, atraumatic, no cyanosis or edema  Lab Results:  Recent Labs  01/01/13 0440 01/02/13 0435  WBC 11.7* 10.3  HGB 12.4 11.0*  PLT 256 242    Recent Labs  01/01/13 1112 01/02/13 0435  NA 132* 135  K 3.1* 3.5  CL 98 103  CO2 24 25  GLUCOSE 133* 111*  BUN 10 8  CREATININE 0.73 0.69   No results found for this basename: TROPONINI, CK, MB,  in the last 72 hours Hepatic Function Panel  Recent Labs  01/02/13 0435  PROT 6.0  ALBUMIN 2.4*  AST 96*  ALT 67*  ALKPHOS 369*  BILITOT 1.6*   No results found for this basename: CHOL,  in the last 72 hours No results found for this basename: PROTIME,  in the last 72 hours  Imaging: Imaging results have been reviewed and No results found.  Cardiac Studies:  Assessment/Plan:  Status post laparoscopic cholecystectomy postop day 1  Elevated LFTs Status post Recurrent chest pain MI rule out, negative nuclear stress test  Coronary artery disease history of and inferior posterior wall MI status post PCI 100% occluded RCA in  September of 2013  Hypertension  New-onset diabetes mellitus controlled by diet  History of V. fib arrest in the past  Hypercholesteremia  COPD  History of tobacco abuse  Positive family history of coronary artery disease  Degenerative joint disease Plan Check labs in a.m. Restart Brilinta  LOS: 6 days    Emberleigh Reily N 01/02/2013, 1:23 PM

## 2013-01-02 NOTE — Progress Notes (Signed)
2 Days Post-Op  Subjective: POD#2 Feeling better Pain much less Having bm's  Objective: Vital signs in last 24 hours: Temp:  [97.8 F (36.6 C)-98.7 F (37.1 C)] 98 F (36.7 C) (04/30 0358) Pulse Rate:  [77-116] 79 (04/30 0358) Resp:  [17-30] 20 (04/30 0358) BP: (92-127)/(59-95) 107/59 mmHg (04/30 0358) SpO2:  [90 %-97 %] 95 % (04/30 0358) Weight:  [198 lb 6.6 oz (90 kg)] 198 lb 6.6 oz (90 kg) (04/30 0358) Last BM Date: 12/30/12  Intake/Output from previous day: 04/29 0701 - 04/30 0700 In: 1860 [P.O.:360; I.V.:1000; IV Piggyback:500] Out: 500 [Urine:500] Intake/Output this shift: Total I/O In: 650 [I.V.:400; IV Piggyback:250] Out: -   Abdomen soft, dressings dry  Lab Results:   Recent Labs  01/01/13 0440 01/02/13 0435  WBC 11.7* 10.3  HGB 12.4 11.0*  HCT 35.0* 31.4*  PLT 256 242   BMET  Recent Labs  01/01/13 1112 01/02/13 0435  NA 132* 135  K 3.1* 3.5  CL 98 103  CO2 24 25  GLUCOSE 133* 111*  BUN 10 8  CREATININE 0.73 0.69  CALCIUM 8.4 8.5   PT/INR No results found for this basename: LABPROT, INR,  in the last 72 hours ABG No results found for this basename: PHART, PCO2, PO2, HCO3,  in the last 72 hours  Studies/Results: No results found.  Anti-infectives: Anti-infectives   Start     Dose/Rate Route Frequency Ordered Stop   12/29/12 1200  metroNIDAZOLE (FLAGYL) IVPB 500 mg     500 mg 100 mL/hr over 60 Minutes Intravenous 4 times per day 12/29/12 1021     12/28/12 2200  cefTRIAXone (ROCEPHIN) 1 g in dextrose 5 % 50 mL IVPB     1 g 100 mL/hr over 30 Minutes Intravenous Every 12 hours 12/28/12 1523     12/28/12 0800  cefTRIAXone (ROCEPHIN) 1 g in dextrose 5 % 50 mL IVPB  Status:  Discontinued     1 g 100 mL/hr over 30 Minutes Intravenous Every 24 hours 12/28/12 0754 12/28/12 1523   12/28/12 0800  azithromycin (ZITHROMAX) 500 mg in dextrose 5 % 250 mL IVPB  Status:  Discontinued     500 mg 250 mL/hr over 60 Minutes Intravenous Every 24 hours  12/28/12 0754 12/28/12 1548      Assessment/Plan: s/p Procedure(s): LAPAROSCOPIC CHOLECYSTECTOMY (N/A)  LFT's back down.  I suspect she passed a small stone post op.  I consulted GI yesterday, but given significant decrease in t.bili, I don't think an ERCP will be necessary.  Will repeat LFT's in the morning.  LOS: 6 days    Bambie Pizzolato A 01/02/2013

## 2013-01-02 NOTE — Progress Notes (Signed)
Subjective: Feeling well.  She is sore from the incision sites.  Objective: Vital signs in last 24 hours: Temp:  [97.3 F (36.3 C)-98.7 F (37.1 C)] 97.3 F (36.3 C) (04/30 0740) Pulse Rate:  [71-116] 71 (04/30 0740) Resp:  [17-30] 19 (04/30 0740) BP: (87-127)/(55-95) 87/55 mmHg (04/30 0740) SpO2:  [90 %-97 %] 97 % (04/30 0740) Weight:  [198 lb 6.6 oz (90 kg)] 198 lb 6.6 oz (90 kg) (04/30 0358) Last BM Date: 12/30/12  Intake/Output from previous day: 04/29 0701 - 04/30 0700 In: 1860 [P.O.:360; I.V.:1000; IV Piggyback:500] Out: 500 [Urine:500] Intake/Output this shift:    General appearance: alert and no distress GI: tender at the incision sites.  Lab Results:  Recent Labs  12/31/12 0640 01/01/13 0440 01/02/13 0435  WBC 7.9 11.7* 10.3  HGB 12.2 12.4 11.0*  HCT 35.2* 35.0* 31.4*  PLT 209 256 242   BMET  Recent Labs  01/01/13 1112 01/02/13 0435  NA 132* 135  K 3.1* 3.5  CL 98 103  CO2 24 25  GLUCOSE 133* 111*  BUN 10 8  CREATININE 0.73 0.69  CALCIUM 8.4 8.5   LFT  Recent Labs  01/02/13 0435  PROT 6.0  ALBUMIN 2.4*  AST 96*  ALT 67*  ALKPHOS 369*  BILITOT 1.6*   PT/INR No results found for this basename: LABPROT, INR,  in the last 72 hours Hepatitis Panel No results found for this basename: HEPBSAG, HCVAB, HEPAIGM, HEPBIGM,  in the last 72 hours C-Diff No results found for this basename: CDIFFTOX,  in the last 72 hours Fecal Lactopherrin No results found for this basename: FECLLACTOFRN,  in the last 72 hours  Studies/Results: No results found.  Medications:  Scheduled: . ALPRAZolam  0.25 mg Oral BID  . aspirin EC  81 mg Oral Daily  . atorvastatin  80 mg Oral q1800  . budesonide-formoterol  1 puff Inhalation Daily  . cefTRIAXone (ROCEPHIN)  IV  1 g Intravenous Q12H  . heparin subcutaneous  5,000 Units Subcutaneous Q8H  . lisinopril  5 mg Oral QPM  . metoprolol tartrate  25 mg Oral BID  . metronidazole  500 mg Intravenous Q6H  .  pantoprazole (PROTONIX) IV  40 mg Intravenous Q24H  . sucralfate  1 g Oral TID WC & HS   Continuous: . sodium chloride 50 mL/hr at 01/02/13 0309  . lactated ringers 20 mL/hr at 12/31/12 1203    Assessment/Plan: 1) Abnormal liver enzynmes. 2) S/p lap chole for acute cholecystitis.   The patient is well clinically.  Her TB has markedly dropped, but her AP is still elevated.  She may have passed a stone.  A repeat liver panel tomorrow will be helpful.  If the AP still remains elevated an MRCP can be ordered, but hopefully it will drop.  Plan: 1) Continue current care. 2) Liver panel in the AM.   LOS: 6 days   [name] D 01/02/2013, 8:06 AM

## 2013-01-03 ENCOUNTER — Inpatient Hospital Stay (HOSPITAL_COMMUNITY): Payer: MEDICAID

## 2013-01-03 LAB — CBC
MCHC: 35 g/dL (ref 30.0–36.0)
MCV: 89.1 fL (ref 78.0–100.0)
Platelets: 243 10*3/uL (ref 150–400)
RDW: 14.8 % (ref 11.5–15.5)
WBC: 11.5 10*3/uL — ABNORMAL HIGH (ref 4.0–10.5)

## 2013-01-03 LAB — COMPREHENSIVE METABOLIC PANEL
AST: 58 U/L — ABNORMAL HIGH (ref 0–37)
Albumin: 2.4 g/dL — ABNORMAL LOW (ref 3.5–5.2)
Calcium: 8.5 mg/dL (ref 8.4–10.5)
Chloride: 106 mEq/L (ref 96–112)
Creatinine, Ser: 0.61 mg/dL (ref 0.50–1.10)
Total Bilirubin: 0.9 mg/dL (ref 0.3–1.2)
Total Protein: 5.9 g/dL — ABNORMAL LOW (ref 6.0–8.3)

## 2013-01-03 MED ORDER — GADOBENATE DIMEGLUMINE 529 MG/ML IV SOLN
20.0000 mL | Freq: Once | INTRAVENOUS | Status: AC | PRN
  Administered 2013-01-03: 18 mL via INTRAVENOUS

## 2013-01-03 MED ORDER — POTASSIUM CHLORIDE CRYS ER 20 MEQ PO TBCR
40.0000 meq | EXTENDED_RELEASE_TABLET | ORAL | Status: AC
  Administered 2013-01-03 (×2): 40 meq via ORAL
  Filled 2013-01-03 (×2): qty 2

## 2013-01-03 NOTE — Progress Notes (Signed)
Patient ID: [name], female   DOB: 16-Apr-1951, 62 y.o.   MRN: [id number]   MRCP negative for retained stone or abnormality. Ok to advance po

## 2013-01-03 NOTE — Progress Notes (Signed)
Subjective:  Patient continues to have vague abdominal pain schedule for MRCP. Denies any chest pain or shortness of breath. Tolerating po okay  Objective:  Vital Signs in the last 24 hours: Temp:  [97.9 F (36.6 C)-98.2 F (36.8 C)] 98.1 F (36.7 C) (05/01 0748) Pulse Rate:  [60-83] 65 (05/01 0955) Resp:  [16-28] 25 (05/01 0748) BP: (89-135)/(56-90) 131/77 mmHg (05/01 0955) SpO2:  [94 %-98 %] 96 % (05/01 0851) Weight:  [88.1 kg (194 lb 3.6 oz)] 88.1 kg (194 lb 3.6 oz) (05/01 0028)  Intake/Output from previous day: 04/30 0701 - 05/01 0700 In: 1925 [P.O.:125; I.V.:1300; IV Piggyback:500] Out: 1300 [Urine:1300] Intake/Output from this shift: Total I/O In: 50 [IV Piggyback:50] Out: 800 [Urine:800]  Physical Exam: Neck: no adenopathy, no carotid bruit, no JVD and supple, symmetrical, trachea midline Lungs: clear to auscultation bilaterally Heart: regular rate and rhythm, S1, S2 normal, no murmur, click, rub or gallop Abdomen: Soft bowel sounds present mild epigastric tenderness no gaurding Extremities: extremities normal, atraumatic, no cyanosis or edema  Lab Results:  Recent Labs  01/02/13 0435 01/03/13 0440  WBC 10.3 11.5*  HGB 11.0* 10.9*  PLT 242 243    Recent Labs  01/02/13 0435 01/03/13 0440  NA 135 141  K 3.5 2.8*  CL 103 106  CO2 25 26  GLUCOSE 111* 83  BUN 8 7  CREATININE 0.69 0.61   No results found for this basename: TROPONINI, CK, MB,  in the last 72 hours Hepatic Function Panel  Recent Labs  01/03/13 0440  PROT 5.9*  ALBUMIN 2.4*  AST 58*  ALT 49*  ALKPHOS 357*  BILITOT 0.9   No results found for this basename: CHOL,  in the last 72 hours No results found for this basename: PROTIME,  in the last 72 hours  Imaging: Imaging results have been reviewed and No results found.  Cardiac Studies:  Assessment/Plan:  Status post laparoscopic cholecystectomy postop day 2 Elevated LFTs  Status post Recurrent chest pain MI rule out, negative  nuclear stress test  Coronary artery disease history of and inferior posterior wall MI status post PCI 100% occluded RCA in September of 2013  Hypertension  New-onset diabetes mellitus controlled by diet  History of V. fib arrest in the past  Hypercholesteremia  COPD  History of tobacco abuse  Positive family history of coronary artery disease  Degenerative joint disease  Plan Continue present management per GI scheduled for MRCP today Okay to transfer to regular floor  LOS: 7 days    Bobetta Korf N 01/03/2013, 10:32 AM

## 2013-01-03 NOTE — Progress Notes (Signed)
3 Days Post-Op  Subjective: She still is having some post op burning and intermittent pain  Objective: Vital signs in last 24 hours: Temp:  [97.9 F (36.6 C)-98.2 F (36.8 C)] 98.1 F (36.7 C) (05/01 0748) Pulse Rate:  [60-83] 80 (05/01 0748) Resp:  [16-28] 25 (05/01 0748) BP: (89-135)/(56-90) 117/90 mmHg (05/01 0748) SpO2:  [94 %-98 %] 96 % (05/01 0748) Weight:  [194 lb 3.6 oz (88.1 kg)] 194 lb 3.6 oz (88.1 kg) (05/01 0028) Last BM Date: 01/02/13  Intake/Output from previous day: 04/30 0701 - 05/01 0700 In: 1925 [P.O.:125; I.V.:1300; IV Piggyback:500] Out: 1300 [Urine:1300] Intake/Output this shift: Total I/O In: -  Out: 800 [Urine:800]  Looks good Abdomen soft, minimally tender  Lab Results:   Recent Labs  01/02/13 0435 01/03/13 0440  WBC 10.3 11.5*  HGB 11.0* 10.9*  HCT 31.4* 31.1*  PLT 242 243   BMET  Recent Labs  01/02/13 0435 01/03/13 0440  NA 135 141  K 3.5 2.8*  CL 103 106  CO2 25 26  GLUCOSE 111* 83  BUN 8 7  CREATININE 0.69 0.61  CALCIUM 8.5 8.5   PT/INR No results found for this basename: LABPROT, INR,  in the last 72 hours ABG No results found for this basename: PHART, PCO2, PO2, HCO3,  in the last 72 hours  Studies/Results: No results found.  Anti-infectives: Anti-infectives   Start     Dose/Rate Route Frequency Ordered Stop   12/29/12 1200  metroNIDAZOLE (FLAGYL) IVPB 500 mg     500 mg 100 mL/hr over 60 Minutes Intravenous 4 times per day 12/29/12 1021     12/28/12 2200  cefTRIAXone (ROCEPHIN) 1 g in dextrose 5 % 50 mL IVPB     1 g 100 mL/hr over 30 Minutes Intravenous Every 12 hours 12/28/12 1523     12/28/12 0800  cefTRIAXone (ROCEPHIN) 1 g in dextrose 5 % 50 mL IVPB  Status:  Discontinued     1 g 100 mL/hr over 30 Minutes Intravenous Every 24 hours 12/28/12 0754 12/28/12 1523   12/28/12 0800  azithromycin (ZITHROMAX) 500 mg in dextrose 5 % 250 mL IVPB  Status:  Discontinued     500 mg 250 mL/hr over 60 Minutes Intravenous  Every 24 hours 12/28/12 0754 12/28/12 1548      Assessment/Plan: s/p Procedure(s): LAPAROSCOPIC CHOLECYSTECTOMY (N/A)  LFT's continue to decrease.  MRCP ordered by GI to make sure she doesn't have some tiny stone floating in the CBD.  She is ok for the floor from a gen surg standpoint  LOS: 7 days    Rhilyn Battle A 01/03/2013

## 2013-01-03 NOTE — Progress Notes (Addendum)
Subjective: She feels irritated this morning.  She is frustrated that can not move about and wants to be transferred to a regular floor.  Some epigastric burning.  Objective: Vital signs in last 24 hours: Temp:  [97.3 F (36.3 C)-98.2 F (36.8 C)] 98 F (36.7 C) (05/01 0028) Pulse Rate:  [60-83] 60 (05/01 0415) Resp:  [16-28] 20 (05/01 0415) BP: (87-135)/(55-72) 103/65 mmHg (05/01 0415) SpO2:  [94 %-98 %] 98 % (05/01 0415) Weight:  [194 lb 3.6 oz (88.1 kg)] 194 lb 3.6 oz (88.1 kg) (05/01 0028) Last BM Date: 01/02/13  Intake/Output from previous day: 04/30 0701 - 05/01 0700 In: 1925 [P.O.:125; I.V.:1300; IV Piggyback:500] Out: 1300 [Urine:1300] Intake/Output this shift:    General appearance: alert and no distress GI: epigastric tenderness  Lab Results:  Recent Labs  01/01/13 0440 01/02/13 0435 01/03/13 0440  WBC 11.7* 10.3 11.5*  HGB 12.4 11.0* 10.9*  HCT 35.0* 31.4* 31.1*  PLT 256 242 243   BMET  Recent Labs  01/01/13 1112 01/02/13 0435 01/03/13 0440  NA 132* 135 141  K 3.1* 3.5 2.8*  CL 98 103 106  CO2 24 25 26   GLUCOSE 133* 111* 83  BUN 10 8 7   CREATININE 0.73 0.69 0.61  CALCIUM 8.4 8.5 8.5   LFT  Recent Labs  01/03/13 0440  PROT 5.9*  ALBUMIN 2.4*  AST 58*  ALT 49*  ALKPHOS 357*  BILITOT 0.9   PT/INR No results found for this basename: LABPROT, INR,  in the last 72 hours Hepatitis Panel No results found for this basename: HEPBSAG, HCVAB, HEPAIGM, HEPBIGM,  in the last 72 hours C-Diff No results found for this basename: CDIFFTOX,  in the last 72 hours Fecal Lactopherrin No results found for this basename: FECLLACTOFRN,  in the last 72 hours  Studies/Results: No results found.  Medications:  Scheduled: . ALPRAZolam  0.25 mg Oral BID  . aspirin EC  81 mg Oral Daily  . atorvastatin  80 mg Oral q1800  . budesonide-formoterol  1 puff Inhalation Daily  . cefTRIAXone (ROCEPHIN)  IV  1 g Intravenous Q12H  . heparin subcutaneous  5,000  Units Subcutaneous Q8H  . lisinopril  5 mg Oral QPM  . metoprolol tartrate  25 mg Oral BID  . metronidazole  500 mg Intravenous Q6H  . pantoprazole  40 mg Oral Daily  . sucralfate  1 g Oral TID WC & HS  . Ticagrelor  90 mg Oral BID   Continuous: . sodium chloride 50 mL/hr at 01/03/13 0700  . lactated ringers 20 mL/hr at 12/31/12 1203    Assessment/Plan: 1) Persistently elevated AP. 2) S/p Lap chole for acute cholecystitis.   WBC has mildly increased.  AP still persistently elevated, but TB has normalized.  I will order an MRCP to further evaluate the situation.  Plan: 1) MRCP. 2) ? Transfer to the regular floor per Dr. Sharyn Lull.   LOS: 7 days   Kazden Largo D 01/03/2013, 7:37 AM  MRCP is negative for choledocholithiasis.

## 2013-01-04 LAB — CBC
HCT: 32.7 % — ABNORMAL LOW (ref 36.0–46.0)
MCH: 31.1 pg (ref 26.0–34.0)
MCV: 88.4 fL (ref 78.0–100.0)
Platelets: 316 10*3/uL (ref 150–400)
RDW: 15.3 % (ref 11.5–15.5)
WBC: 13.4 10*3/uL — ABNORMAL HIGH (ref 4.0–10.5)

## 2013-01-04 LAB — BASIC METABOLIC PANEL
BUN: 7 mg/dL (ref 6–23)
CO2: 25 mEq/L (ref 19–32)
Calcium: 9.2 mg/dL (ref 8.4–10.5)
Chloride: 104 mEq/L (ref 96–112)
Creatinine, Ser: 0.53 mg/dL (ref 0.50–1.10)
GFR calc Af Amer: >90 mL/min (ref >90)

## 2013-01-04 NOTE — Progress Notes (Signed)
Patient ID: [name], female   DOB: 12-02-1950, 62 y.o.   MRN: [id number] 4 Days Post-Op  Subjective: POD#4, a little soreness this am but hopeful to go home, some nausea with eating but not severe  Objective: Vital signs in last 24 hours: Temp:  [97.9 F (36.6 C)-98.8 F (37.1 C)] 98.8 F (37.1 C) (05/02 0619) Pulse Rate:  [65-85] 76 (05/02 0619) Resp:  [17-25] 18 (05/02 0619) BP: (115-140)/(63-90) 137/76 mmHg (05/02 0619) SpO2:  [94 %-97 %] 94 % (05/02 0619) Last BM Date: 01/03/13  Intake/Output from previous day: 05/01 0701 - 05/02 0700 In: 1266 [I.V.:1216; IV Piggyback:50] Out: 800 [Urine:800] Intake/Output this shift:    Abdomen soft, incisions c/d/i  Lab Results:   Recent Labs  01/03/13 0440 01/04/13 0535  WBC 11.5* 13.4*  HGB 10.9* 11.5*  HCT 31.1* 32.7*  PLT 243 316   BMET  Recent Labs  01/02/13 0435 01/03/13 0440  NA 135 141  K 3.5 2.8*  CL 103 106  CO2 25 26  GLUCOSE 111* 83  BUN 8 7  CREATININE 0.69 0.61  CALCIUM 8.5 8.5   PT/INR No results found for this basename: LABPROT, INR,  in the last 72 hours ABG No results found for this basename: PHART, PCO2, PO2, HCO3,  in the last 72 hours  Studies/Results: [name]  01/03/2013  *RADIOLOGY REPORT*  Clinical Data:  Persistently elevated alkaline phosphatase levels status post laparoscopic cholecystectomy for acute cholecystitis. Evaluate for choledocholithiasis.  MRI ABDOMEN WITHOUT AND WITH CONTRAST (INCLUDING MRCP)  Technique:  Multiplanar multisequence Kristina imaging of the abdomen was performed both before and after the administration of intravenous contrast. Heavily T2-weighted images of the biliary and pancreatic ducts were obtained, and three-dimensional MRCP images were rendered by post processing.  Contrast: 18mL MULTIHANCE GADOBENATE DIMEGLUMINE 529 MG/ML IV SOLN  Comparison:  Abdominal CT 12/29/2012 and 03/26/2008.  Abdominal ultrasound 12/28/2012.  Findings:  The patient has  undergone recent cholecystectomy.  There is ill-defined signal within the cholecystectomy bed with areas of increased and decreased T2 signal, likely postoperative fluid and/or packing material.  The right upper quadrant inflammatory changes have improved compared with the prior CT.  There is no other focal extraluminal fluid collection.  Some subcutaneous edema is present within the right lateral abdominal wall.  There is no intra or extrahepatic biliary dilatation.  There is no evidence of choledocholithiasis.  The pancreatic duct appears normal.  There is no evidence of pancreas divisum.  The pancreas appears normal.  The liver demonstrates heterogeneous loss of signal on the opposed phase gradient echo images consistent with steatosis. No focal lesions are identified.  The spleen, adrenal glands and kidneys appear normal.  Atelectasis is present at both lung bases.  Bilateral breast implants are noted.  There are fluid collections lateral and anterior to the right prosthesis which are unchanged from prior CT and consistent with chronic extravasated silicone.  IMPRESSION:  1.  Normal MRCP status post cholecystectomy.  No evidence of choledocholithiasis. 2.  Nonspecific fluid collection within the cholecystectomy bed. There is no ascites. 3.  Hepatic steatosis. 4.  Bibasilar atelectasis.   Original Report Authenticated By: Carey Bullocks, M.D.     Anti-infectives: Anti-infectives   Start     Dose/Rate Route Frequency Ordered Stop   12/29/12 1200  metroNIDAZOLE (FLAGYL) IVPB 500 mg     500 mg 100 mL/hr over 60 Minutes Intravenous 4 times per day 12/29/12 1021     12/28/12 2200  cefTRIAXone (ROCEPHIN) 1 g in dextrose 5 % 50 mL IVPB     1 g 100 mL/hr over 30 Minutes Intravenous Every 12 hours 12/28/12 1523     12/28/12 0800  cefTRIAXone (ROCEPHIN) 1 g in dextrose 5 % 50 mL IVPB  Status:  Discontinued     1 g 100 mL/hr over 30 Minutes Intravenous Every 24 hours 12/28/12 0754 12/28/12 1523   12/28/12  0800  azithromycin (ZITHROMAX) 500 mg in dextrose 5 % 250 mL IVPB  Status:  Discontinued     500 mg 250 mL/hr over 60 Minutes Intravenous Every 24 hours 12/28/12 0754 12/28/12 1548      Assessment/Plan: s/p Procedure(s): LAPAROSCOPIC CHOLECYSTECTOMY (N/A)  LFT's normalizing. MRCP normal, WBC slightly up but think she is stable from surgical standpoint.  Has lots of sinus drainage per patient right now which could be causing her nausea.  Dr. Magnus Ivan will see patient later today but I do not think that there is anything from a surgical standpoint that would keep her from going home.  She will need a follow up with [place] in 2 weeks .  LOS: 8 days    [name] 01/04/2013

## 2013-01-04 NOTE — Progress Notes (Signed)
Discharge patient, home discharge instruction given, no questions verbalized. 

## 2013-01-04 NOTE — Progress Notes (Signed)
I have seen and examined the patient and agree with the assessment and plans. Ok to discharge from surg standpoint  Robey Massmann A. Magnus Ivan  MD, FACS

## 2013-01-04 NOTE — Discharge Summary (Signed)
  Discharge summary dictated on 01/04/2013 dictation number is [phone]

## 2013-01-04 NOTE — Discharge Instructions (Signed)
[facility] ______[facility] °LAPAROSCOPIC SURGERY: POST OP INSTRUCTIONS °Always review your discharge instruction sheet given to you by the facility where your surgery was performed. °IF YOU HAVE DISABILITY OR FAMILY LEAVE FORMS, YOU MUST BRING THEM TO THE OFFICE FOR PROCESSING.   °DO NOT GIVE THEM TO YOUR DOCTOR. ° °1. A prescription for pain medication may be given to you upon discharge.  Take your pain medication as prescribed, if needed.  If narcotic pain medicine is not needed, then you may take acetaminophen (Tylenol) or ibuprofen (Advil) as needed. °2. Take your usually prescribed medications unless otherwise directed. °3. If you need a refill on your pain medication, please contact your pharmacy.  They will contact our office to request authorization. Prescriptions will not be filled after 5pm or on week-ends. °4. You should follow a light diet the first few days after arrival home, such as soup and crackers, etc.  Be sure to include lots of fluids daily. °5. Most patients will experience some swelling and bruising in the area of the incisions.  Ice packs will help.  Swelling and bruising can take several days to resolve.  °6. It is common to experience some constipation if taking pain medication after surgery.  Increasing fluid intake and taking a stool softener (such as Colace) will usually help or prevent this problem from occurring.  A mild laxative (Milk of Magnesia or Miralax) should be taken according to package instructions if there are no bowel movements after 48 hours. °7. Unless discharge instructions indicate otherwise, you may remove your bandages 24-48 hours after surgery, and you may shower at that time.  You may have steri-strips (small skin tapes) in place directly over the incision.  These strips should be left on the skin for 7-10 days.  If your surgeon used skin glue on the incision, you may shower in 24 hours.  The glue will flake off over the next 2-3 weeks.  Any sutures or  staples will be removed at the office during your follow-up visit. °8. ACTIVITIES:  You may resume regular (light) daily activities beginning the next day--such as daily self-care, walking, climbing stairs--gradually increasing activities as tolerated.  You may have sexual intercourse when it is comfortable.  Refrain from any heavy lifting or straining until approved by your doctor. °a. You may drive when you are no longer taking prescription pain medication, you can comfortably wear a seatbelt, and you can safely maneuver your car and apply brakes. °b. RETURN TO WORK:  __________________________________________________________ °9. You should see your doctor in the office for a follow-up appointment approximately 2-3 weeks after your surgery.  Make sure that you call for this appointment within a day or two after you arrive home to insure a convenient appointment time. °10. OTHER INSTRUCTIONS: __________________________________________________________________________________________________________________________ __________________________________________________________________________________________________________________________ °WHEN TO CALL YOUR DOCTOR: °1. Fever over 101.0 °2. Inability to urinate °3. Continued bleeding from incision. °4. Increased pain, redness, or drainage from the incision. °5. Increasing abdominal pain ° °The clinic staff is available to answer your questions during regular business hours.  Please don’t hesitate to call and ask to speak to one of the nurses for clinical concerns.  If you have a medical emergency, go to the nearest emergency room or call 911.  A surgeon from [facility] is always on call at the hospital. °[address], [address], [place], [place]  [address] ? [address] [address], [place], [place]   [address] °[phone] ? [phone] ? FAX [phone] °Web site:   [email or web address] °

## 2013-01-05 NOTE — Discharge Summary (Signed)
NAMEREAGHAN, KAWA                ACCOUNT NO.:  [id number]  MEDICAL RECORD NO.:  [id number]  LOCATION:  6N04C                        FACILITY:  [facility]  PHYSICIAN:  Demetrius Barrell N. Sharyn Lull, M.D. DATE OF BIRTH:  [birth year]  DATE OF ADMISSION:  12/27/2012 DATE OF DISCHARGE:  01/04/2013                              DISCHARGE SUMMARY   ADMITTING DIAGNOSES: 1. Recurrent chest pain rule out myocardial infarction and rule out     restenosis. 2. 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  RCA in September     2013, hypertension, abdominal pain, rule out gastroesophageal     reflux disease, rule out peptic ulcer disease, rule out gallbladder     disease, hypertension, history of ventricular fibrillation  arrest     in the past. 3. Hypercholesteremia. 4. Chronic obstructive pulmonary disease. 5. History of tobacco abuse. 6. Positive family history of coronary artery disease. 7. Degenerative joint disease.  DISCHARGE DIAGNOSES: 1. Status post acute cholecystitis, status post laparoscopic     cholecystectomy. 2. Status post recurrent chest pain, myocardial infarction ruled out.     Negative nuclear stress test. 3. Coronary artery disease, history of inferoposterior wall myocardial     infarction in the past, status post PCI to 100% occluded.  RCA in     September 2013, hypertension, new onset diabetes mellitus     controlled by diet, history of ventricular fibrillation arrest in     the past. 4. Hypercholesteremia. 5. Chronic obstructive pulmonary disease. 6. History of tobacco abuse. 7. Positive family history of coronary artery disease. 8. Degenerative joint disease.  DISCHARGE HOME MEDICATIONS:  Tylenol 500 mg every 4 hours as needed for pain as before, albuterol inhaler 2 puffs 2 times daily as needed, Xanax 0.25 mg twice daily as before, enteric-coated aspirin 81 mg 1 tab daily, Brilinta 90 mg 1 tablet twice  daily, Symbicort 1 puff twice daily as before, lisinopril 10 mg 1 tablet daily, metoprolol tartrate 12.5 mg twice daily, Nitrostat sublingual use as directed, omeprazole 20 mg 1 capsule daily, pravastatin 40 mg 1 tablet daily, and ranitidine 150 mg twice daily.  DIET:  Low salt, low cholesterol.  ACTIVITY:  Increase activity slowly as tolerated.  DISCHARGE FOLLOWUP:  Follow up with Dr. Magnus Ivan in next 1-2 weeks. Follow up with me in 1 week.  CONDITION AT DISCHARGE:  Stable.  BRIEF HISTORY AND HOSPITAL COURSE:  Ms. Brisbon is a 62 year old female with past medical history significant for coronary artery disease, history of inferoposterior wall myocardial infarction in September 2013, status post emergency PTCA stenting to occluded 100% RCA, history of VFib arrest in the ER, hypertension, hypercholesteremia, COPD, history of tobacco abuse, and positive family history of coronary artery disease.  She came to the ER by EMS complaining of recurrent epigastric and retrosternal chest pain occasionally radiating to jaw and left side of the neck and associated with nausea, grade 7/10 off and on for last few weeks.  Today, the pain got worse, so decided to call EMS.  The patient denies any palpitation, lightheadedness, or syncope.  Denies PND, orthopnea, or leg swelling.  Denies  any relation of chest pain to food, breathing, or movement.  The patient states she had similar pain off and on in December and had stress test, which was negative by her cardiologist in [facility] and subsequently had one admission here at [facility], ruled out for MI and subsequently was discharged to be followed with her primary cardiologist in [place].  PAST MEDICAL HISTORY:  As above.  PAST SURGICAL HISTORY:  She had tonsillectomy in the past, appendectomy in the past, had vulvectomy in the past, and breast surgery in the past.  PHYSICAL EXAMINATION:  VITAL SIGNS:  Blood pressure was 172/86, pulse was  63.  She was afebrile. HEENT:  Conjunctivae was pink. NECK:  Supple.  No JVD.  No bruit. LUNGS:  Clear to auscultation without rhonchi or rales. CARDIOVASCULAR:  S1 and S2 was normal.  There was soft systolic murmur. No S3, gallop. ABDOMEN:  Soft.  Bowel sounds were present.  Nontender. EXTREMITIES:  There was no clubbing, cyanosis, or edema.  LABORATORY DATA:  Sodium was 138, potassium 3.9, BUN 17, creatinine 0.57, hemoglobin was 15.5, hematocrit 44.6.  Initial white count of 13.2.  Nuclear stress test showed no evidence of reversible ischemia.  There was fixed decreased perfusion in the inferior wall and septum compatible with prior infarct, EF of 52%.  EKG showed normal sinus rhythm with old inferior wall myocardial infarction.  No acute ischemic changes.  The patient has abdominal ultrasound, which showed enlarging polypoid lesion in the gallbladder versus developing stones with no evidence of acute cholecystitis and diffuse fatty infiltration of the liver.  The patient subsequently had CT of the abdomen, which showed findings concerning for acute cholecystitis, atelectasis, and colonic diverticulosis without any evidence of acute diverticulitis.  MRCP showed normal MRCP, status post cholecystectomy, no evidence of choledocholithiasis, nonspecific fluid collection within the cholecystectomy bed.  There is no ascites.  BRIEF HOSPITAL COURSE:  The patient was admitted to telemetry unit.  MI was ruled out by serial enzymes and EKG.  The patient subsequently underwent nuclear stress test, which was negative for ischemia.  The patient continued to have abdominal pain.  GI consultation was obtained. The patient initially had abdominal ultrasound followed by CT of the abdomen, which suggested possible acute cholecystitis.  The patient had also progressive increasing white count, was started on broad-spectrum antibiotics.  Surgical consultation was obtained with Dr. Magnus Ivan and the  patient subsequently underwent laparoscopic cholecystectomy.  Her Brilinta was held for 3 days prior to the surgery and was started after the surgery.  The patient has mild epigastric and right upper quadrant pain.  Her LFTs were elevated subsequently.  The patient underwent MRCP, which did not suggest any evidence of choledocholithiasis.  The patient is ambulating in room without any problems, tolerating p.o. without any problems. The patient is eager to go home.  Her potassium yesterday was low, which has been replaced.  We will recheck her electrolytes before discharge. The patient will be followed up in my office in 1 week and Dr. Magnus Ivan office in 1-2 weeks.     Eduardo Osier. Sharyn Lull, M.D.     [facility]  D:  01/04/2013  T:  01/05/2013  Job:  [id number]

## 2013-01-22 ENCOUNTER — Ambulatory Visit (INDEPENDENT_AMBULATORY_CARE_PROVIDER_SITE_OTHER): Payer: BC Managed Care – PPO | Admitting: Surgery

## 2013-01-22 ENCOUNTER — Encounter (INDEPENDENT_AMBULATORY_CARE_PROVIDER_SITE_OTHER): Payer: Self-pay | Admitting: Surgery

## 2013-01-22 VITALS — BP 134/78 | HR 70 | Temp 97.0°F | Resp 18 | Ht 64.0 in | Wt 177.0 lb

## 2013-01-22 DIAGNOSIS — Z09 Encounter for follow-up examination after completed treatment for conditions other than malignant neoplasm: Secondary | ICD-10-CM

## 2013-01-22 NOTE — Progress Notes (Signed)
Subjective:     Patient ID: [name], female   DOB: Dec 06, 1950, 62 y.o.   MRN: [id number]  HPI She is here status post a recent hospitalization for cardiac issues and cholecystitis. She had a laparoscopic cholecystectomy. She is still having loose bowel movements postop. She also has heartburn.  Review of Systems     Objective:   Physical Exam On exam, her incisions are healing well.    Assessment:     Patient stable postop     Plan:     Hopefully, the symptoms resolve over time. If not she will call me back and we May colon a bile  binding medications.  If she improves, I will see her back as needed

## 2013-06-04 ENCOUNTER — Encounter (INDEPENDENT_AMBULATORY_CARE_PROVIDER_SITE_OTHER): Payer: Self-pay

## 2013-10-23 ENCOUNTER — Other Ambulatory Visit (HOSPITAL_COMMUNITY): Payer: Self-pay | Admitting: Cardiology

## 2013-10-23 DIAGNOSIS — R079 Chest pain, unspecified: Secondary | ICD-10-CM

## 2013-10-30 ENCOUNTER — Encounter (HOSPITAL_COMMUNITY): Payer: BC Managed Care – PPO

## 2013-10-30 ENCOUNTER — Ambulatory Visit (HOSPITAL_COMMUNITY): Payer: BC Managed Care – PPO

## 2013-11-06 ENCOUNTER — Ambulatory Visit (HOSPITAL_COMMUNITY): Payer: BC Managed Care – PPO

## 2013-11-06 ENCOUNTER — Encounter (HOSPITAL_COMMUNITY): Payer: BC Managed Care – PPO

## 2014-01-13 ENCOUNTER — Other Ambulatory Visit: Payer: Self-pay | Admitting: Family Medicine

## 2014-01-13 DIAGNOSIS — M79606 Pain in leg, unspecified: Secondary | ICD-10-CM

## 2014-01-16 ENCOUNTER — Ambulatory Visit
Admission: RE | Admit: 2014-01-16 | Discharge: 2014-01-16 | Disposition: A | Discharge status: Home / Self Care | Payer: BC Managed Care – PPO | Source: Ambulatory Visit | Attending: Family Medicine | Admitting: Family Medicine

## 2014-01-16 DIAGNOSIS — M79606 Pain in leg, unspecified: Secondary | ICD-10-CM

## 2014-01-22 ENCOUNTER — Other Ambulatory Visit: Payer: Self-pay | Admitting: *Deleted

## 2014-01-22 DIAGNOSIS — I739 Peripheral vascular disease, unspecified: Secondary | ICD-10-CM

## 2014-01-30 ENCOUNTER — Encounter: Payer: Self-pay | Admitting: Vascular Surgery

## 2014-01-31 ENCOUNTER — Other Ambulatory Visit: Payer: Self-pay

## 2014-01-31 ENCOUNTER — Encounter: Payer: Self-pay | Admitting: Vascular Surgery

## 2014-01-31 ENCOUNTER — Ambulatory Visit (HOSPITAL_COMMUNITY)
Admission: RE | Admit: 2014-01-31 | Discharge: 2014-01-31 | Disposition: A | Discharge status: Home / Self Care | Payer: BC Managed Care – PPO | Source: Ambulatory Visit | Attending: Vascular Surgery | Admitting: Vascular Surgery

## 2014-01-31 ENCOUNTER — Ambulatory Visit (INDEPENDENT_AMBULATORY_CARE_PROVIDER_SITE_OTHER): Payer: BC Managed Care – PPO | Admitting: Vascular Surgery

## 2014-01-31 VITALS — BP 122/72 | HR 72 | Ht 64.0 in | Wt 185.0 lb

## 2014-01-31 DIAGNOSIS — I70219 Atherosclerosis of native arteries of extremities with intermittent claudication, unspecified extremity: Secondary | ICD-10-CM | POA: Insufficient documentation

## 2014-01-31 DIAGNOSIS — I739 Peripheral vascular disease, unspecified: Secondary | ICD-10-CM | POA: Insufficient documentation

## 2014-01-31 NOTE — Progress Notes (Signed)
Referred by:  Chesley Noon, MD [address] [place], [place] [address]  Reason for referral: Bilateral leg pain  History of Present Illness  [name] is a 63 y.o. (27-Oct-1950) female who presents with chief complaint: bilateral thigh and foot pain.  Onset of symptom occurred >3 moths, without obvious trigger.  Pain is described as aching in thighs and initially L foot but now bilaterally, severity 3-6/10, and associated with ambulation.  Patient has attempted to treat this pain with rest and OTC rx.  The patient has no rest pain symptoms also and no leg wounds/ulcers.  Atherosclerotic risk factors include: HTN, HLD, active smoking.  Past Medical History  Diagnosis Date  . Hypertension   . GERD (gastroesophageal reflux disease)   . Hypercholesteremia   . ST elevation myocardial infarction (STEMI) of inferoposterior wall September 2013    S/P DES X 3 RCA, EF 50%  . Chronic hip pain   . Arthritis   . H/O hiatal hernia   . CAD (coronary artery disease)     Past Surgical History  Procedure Laterality Date  . Tonsillectomy    . Appendectomy    . Vulvectomy    . Breast surgery    . Cholecystectomy N/A 12/31/2012    Procedure: LAPAROSCOPIC CHOLECYSTECTOMY;  Surgeon: Harl Bowie, MD;  Location: [facility];  Service: General;  Laterality: N/A;  . Tubal ligation      History   Social History  . Marital Status: Divorced    Spouse Name: N/A    Number of Children: N/A  . Years of Education: N/A   Occupational History  . Not on file.   Social History Main Topics  . Smoking status: Current Every Day Smoker -- 0.50 packs/day for 50 years  . Smokeless tobacco: Not on file  . Alcohol Use: No  . Drug Use: No  . Sexual Activity: Not on file   Other Topics Concern  . Not on file   Social History Narrative  . No narrative on file    Family History  Problem Relation Age of Onset  . Hypertension Mother   . Heart disease Mother     before age 33   . Cancer  Father   . Diabetes Father   . Hypertension Father   . Hyperlipidemia Father   . Varicose Veins Father   . Heart attack Father   . Hypertension Sister   . Hypertension Brother   . Heart disease Brother     before age 22  . Heart attack Brother   . Hyperlipidemia Son   . Peripheral vascular disease Son     Current Outpatient Prescriptions on File Prior to Visit  Medication Sig Dispense Refill  . acetaminophen (TYLENOL) 500 MG tablet Take 500-1,000 mg by mouth every 4 (four) hours as needed for pain. For pain      . albuterol (PROVENTIL HFA;VENTOLIN HFA) 108 (90 BASE) MCG/ACT inhaler Inhale 2 puffs into the lungs 2 (two) times daily as needed for wheezing or shortness of breath. For wheezing      . ALPRAZolam (XANAX) 0.5 MG tablet Take 1 mg by mouth 2 (two) times daily.       Marland Kitchen aspirin EC 81 MG EC tablet Take 1 tablet (81 mg total) by mouth daily.  30 tablet  3  . budesonide-formoterol (SYMBICORT) 160-4.5 MCG/ACT inhaler Inhale 1 puff into the lungs daily.      Marland Kitchen esomeprazole (NEXIUM) 40 MG capsule Take 40  mg by mouth daily before breakfast.      . lisinopril (PRINIVIL,ZESTRIL) 10 MG tablet Take 10 mg by mouth every evening.      . metoprolol tartrate (LOPRESSOR) 25 MG tablet Take 12.5 mg by mouth 2 (two) times daily.      . nitroGLYCERIN (NITROSTAT) 0.4 MG SL tablet Place 1 tablet (0.4 mg total) under the tongue every 5 (five) minutes x 3 doses as needed for chest pain.  25 tablet  3  . omeprazole (PRILOSEC) 20 MG capsule Take 20 mg by mouth 2 (two) times daily.      . pravastatin (PRAVACHOL) 40 MG tablet Take 40 mg by mouth every evening.      . ranitidine (ZANTAC) 150 MG tablet Take 150 mg by mouth 2 (two) times daily.      . Ticagrelor (BRILINTA) 90 MG TABS tablet Take 1 tablet (90 mg total) by mouth 2 (two) times daily.  60 tablet  11   No current facility-administered medications on file prior to visit.    Allergies  Allergen Reactions  . Ciprofloxacin Anaphylaxis  . Codeine  Other (See Comments)    Unknown reaction  . Erythromycin Nausea And Vomiting  . Penicillins Rash  . Sulfa Antibiotics Rash    REVIEW OF SYSTEMS:  (Positives checked otherwise negative)  CARDIOVASCULAR:  [x] chest pain, [ ] chest pressure, [ ] palpitations, [x] shortness of breath when laying flat, [x] shortness of breath with exertion,   [x] pain in feet when walking, [x] pain in feet when laying flat, [ ] history of blood clot in veins (DVT), [ ] history of phlebitis, [x] swelling in legs, [ ] varicose veins  PULMONARY:  [ ] productive cough, [ ] asthma, [x] wheezing  NEUROLOGIC:  [x] weakness in arms or legs, [x] numbness in arms or legs, [ ] difficulty speaking or slurred speech, [ ] temporary loss of vision in one eye, [x] dizziness  HEMATOLOGIC:  [ ] bleeding problems, [ ] problems with blood clotting too easily  MUSCULOSKEL:  [ ] joint pain, [ ] joint swelling  GASTROINTEST:  [ ]  Vomiting blood, [ ]  Blood in stool     GENITOURINARY:  [ ]  Burning with urination, [ ]  Blood in urine  PSYCHIATRIC:  [x] history of major depression  INTEGUMENTARY:  [ ] rashes, [ ] ulcers  CONSTITUTIONAL:  [ ] fever, [ ] chills  For VQI Use Only  PRE-ADM LIVING: Home  AMB STATUS: Ambulatory  CAD Sx: History of MI, but no symptoms No MI within 6 months  PRIOR CHF: None  STRESS TEST: [x] No, [ ] Normal, [ ] + ischemia, [ ] + MI, [ ] Both   Physical Examination Filed Vitals:   01/31/14 1002  BP: 122/72  Pulse: 72  Height: 5' 4" (1.626 m)  Weight: 185 lb (83.915 kg)  SpO2: 100%   Body mass index is 31.74 kg/(m^2).  General: A&O x 3, WDWN, appears older than stated age  Head: [place]/AT  Ear/Nose/Throat: Hearing grossly intact, nares w/o erythema or drainage, oropharynx w/o Erythema/Exudate  Eyes: PERRLA, EOMI  Neck: Supple, no nuchal rigidity, no palpable LAD  Pulmonary: Sym exp, good air movt, CTAB, no rales, rhonchi, & wheezing  Cardiac: RRR, Nl S1, S2, no Murmurs, rubs or  gallops  Vascular: Vessel Right Left  Radial Faintly Palpable Faintly Palpable  Brachial Faintly Palpable Palpable  Carotid Palpable, without bruit Palpable, without bruit  Aorta Not palpable   N/A  Femoral Palpable Palpable  Popliteal Not palpable Not palpable  PT Not Palpable Palpable  DP Not Palpable Palpable   Gastrointestinal: soft, NTND, -G/R, - HSM, - masses, - CVAT B  Musculoskeletal: M/S 5/5 throughout , Extremities without ischemic changes , cyanotic feet  Neurologic: CN 2-12 intact , Pain and light touch intact in extremities except somewhat decreased in L foot , Motor exam as listed above  Psychiatric: Judgment intact, Mood & affect appropriatefor pt's clinical situation  Dermatologic: See M/S exam for extremity exam, no rashes otherwise noted  Lymph : No Cervical, Axillary, or Inguinal lymphadenopathy   Non-Invasive Vascular Imaging  Outside ABI (Date: 01/16/14)  R: 0.89, PT biphasic, TBI 0.52  L: 1.03, DP: tri, PT: tri, TBI: 0.86  RLE arterial duplex (01/31/2014)  Monophasic waveforms throughout  No hemodynamically significant stenoses found  Possible inflow disease  Outside Studies/Documentation 6 pages of outside documents were reviewed including: outside BLE physiologic study.  Medical Decision Making  Irianna W Kujawa is a 62 y.o. 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  Her RLE studies conflict with each other.  The study today is concerning for interval right iliac system occlusion, so I recommended proceeding with an Aortogram, bilateral leg runoff, and possible right iliac intervention.  This is scheduled for this coming Thursday. I discussed with the patient the nature of angiographic procedures, especially the limited patencies of any endovascular  intervention.  The patient is aware of that the risks of an angiographic procedure include but are not limited to: bleeding, infection, access site complications, renal failure, embolization, rupture of vessel, dissection, possible need for emergent surgical intervention, possible need for surgical procedures to treat the patient's pathology, anaphylactic reaction to contrast, and stroke and death.  The patient is aware of the risks and agrees to proceed.  I discussed in depth with the patient the nature of atherosclerosis, and emphasized the importance of maximal medical management including strict control of blood pressure, blood glucose, and lipid levels, antiplatelet agent, obtaining regular exercise, and cessation of smoking.   The patient is currently on a statin: ASA. The patient is currently on an anti-platelet: Lipitor. After her angiographic studies are completed, I would obtain a BLE venous insufficiency duplex to evaluate the valves in her legs, as the patient continues to fixate on the cyanosis in her feet.  Thank you for allowing us to participate in this patient's care.  Karys Meckley, MD [facility] Office: [phone] Pager: [phone]  01/31/2014, 10:31 AM    

## 2014-02-03 ENCOUNTER — Encounter (HOSPITAL_COMMUNITY): Payer: Self-pay

## 2014-02-05 MED ORDER — SODIUM CHLORIDE 0.9 % IV SOLN
INTRAVENOUS | Status: DC
  Administered 2014-02-06: 08:00:00 via INTRAVENOUS

## 2014-02-06 ENCOUNTER — Ambulatory Visit (HOSPITAL_COMMUNITY)
Admission: RE | Admit: 2014-02-06 | Discharge: 2014-02-06 | Disposition: A | Discharge status: Home / Self Care | Payer: Medicare Other | Source: Ambulatory Visit | Attending: Vascular Surgery | Admitting: Vascular Surgery

## 2014-02-06 ENCOUNTER — Encounter (HOSPITAL_COMMUNITY)
Admission: RE | Disposition: A | Discharge status: Home / Self Care | Payer: Medicare Other | Source: Ambulatory Visit | Attending: Vascular Surgery

## 2014-02-06 ENCOUNTER — Other Ambulatory Visit: Payer: Self-pay | Admitting: *Deleted

## 2014-02-06 DIAGNOSIS — I10 Essential (primary) hypertension: Secondary | ICD-10-CM | POA: Insufficient documentation

## 2014-02-06 DIAGNOSIS — I2510 Atherosclerotic heart disease of native coronary artery without angina pectoris: Secondary | ICD-10-CM | POA: Insufficient documentation

## 2014-02-06 DIAGNOSIS — E785 Hyperlipidemia, unspecified: Secondary | ICD-10-CM | POA: Insufficient documentation

## 2014-02-06 DIAGNOSIS — Z7982 Long term (current) use of aspirin: Secondary | ICD-10-CM | POA: Insufficient documentation

## 2014-02-06 DIAGNOSIS — I708 Atherosclerosis of other arteries: Secondary | ICD-10-CM | POA: Insufficient documentation

## 2014-02-06 DIAGNOSIS — Z01810 Encounter for preprocedural cardiovascular examination: Secondary | ICD-10-CM

## 2014-02-06 DIAGNOSIS — K219 Gastro-esophageal reflux disease without esophagitis: Secondary | ICD-10-CM | POA: Insufficient documentation

## 2014-02-06 DIAGNOSIS — I252 Old myocardial infarction: Secondary | ICD-10-CM | POA: Insufficient documentation

## 2014-02-06 DIAGNOSIS — F17200 Nicotine dependence, unspecified, uncomplicated: Secondary | ICD-10-CM | POA: Insufficient documentation

## 2014-02-06 DIAGNOSIS — I739 Peripheral vascular disease, unspecified: Secondary | ICD-10-CM

## 2014-02-06 DIAGNOSIS — Z7902 Long term (current) use of antithrombotics/antiplatelets: Secondary | ICD-10-CM | POA: Insufficient documentation

## 2014-02-06 DIAGNOSIS — I70219 Atherosclerosis of native arteries of extremities with intermittent claudication, unspecified extremity: Secondary | ICD-10-CM

## 2014-02-06 HISTORY — PX: ABDOMINAL AORTAGRAM: SHX5454

## 2014-02-06 LAB — POCT I-STAT, CHEM 8
BUN: 15 mg/dL (ref 6–23)
CHLORIDE: 107 meq/L (ref 96–112)
CREATININE: 0.7 mg/dL (ref 0.50–1.10)
Calcium, Ion: 1.11 mmol/L — ABNORMAL LOW (ref 1.13–1.30)
GLUCOSE: 107 mg/dL — AB (ref 70–99)
HCT: 45 % (ref 36.0–46.0)
Hemoglobin: 15.3 g/dL — ABNORMAL HIGH (ref 12.0–15.0)
Potassium: 3.6 mEq/L — ABNORMAL LOW (ref 3.7–5.3)
Sodium: 143 mEq/L (ref 137–147)
TCO2: 22 mmol/L (ref 0–100)

## 2014-02-06 SURGERY — ABDOMINAL AORTAGRAM
Anesthesia: LOCAL

## 2014-02-06 MED ORDER — FENTANYL CITRATE 0.05 MG/ML IJ SOLN
INTRAMUSCULAR | Status: AC
  Filled 2014-02-06: qty 2

## 2014-02-06 MED ORDER — SODIUM CHLORIDE 0.9 % IV SOLN: 1.0000 mL/kg/h | INTRAVENOUS | Status: DC

## 2014-02-06 MED ORDER — MIDAZOLAM HCL 2 MG/2ML IJ SOLN
INTRAMUSCULAR | Status: AC
  Filled 2014-02-06: qty 2

## 2014-02-06 MED ORDER — LIDOCAINE HCL (PF) 1 % IJ SOLN
INTRAMUSCULAR | Status: AC
  Filled 2014-02-06: qty 30

## 2014-02-06 MED ORDER — HEPARIN (PORCINE) IN NACL 2-0.9 UNIT/ML-% IJ SOLN
INTRAMUSCULAR | Status: AC
  Filled 2014-02-06: qty 1000

## 2014-02-06 NOTE — H&P (View-Only) (Signed)
Referred by:  Chesley Noon, MD [address] [place], [place] [address]  Reason for referral: Bilateral leg pain  History of Present Illness  [name] is a 63 y.o. (27-Oct-1950) female who presents with chief complaint: bilateral thigh and foot pain.  Onset of symptom occurred >3 moths, without obvious trigger.  Pain is described as aching in thighs and initially L foot but now bilaterally, severity 3-6/10, and associated with ambulation.  Patient has attempted to treat this pain with rest and OTC rx.  The patient has no rest pain symptoms also and no leg wounds/ulcers.  Atherosclerotic risk factors include: HTN, HLD, active smoking.  Past Medical History  Diagnosis Date  . Hypertension   . GERD (gastroesophageal reflux disease)   . Hypercholesteremia   . ST elevation myocardial infarction (STEMI) of inferoposterior wall September 2013    S/P DES X 3 RCA, EF 50%  . Chronic hip pain   . Arthritis   . H/O hiatal hernia   . CAD (coronary artery disease)     Past Surgical History  Procedure Laterality Date  . Tonsillectomy    . Appendectomy    . Vulvectomy    . Breast surgery    . Cholecystectomy N/A 12/31/2012    Procedure: LAPAROSCOPIC CHOLECYSTECTOMY;  Surgeon: Harl Bowie, MD;  Location: [facility];  Service: General;  Laterality: N/A;  . Tubal ligation      History   Social History  . Marital Status: Divorced    Spouse Name: N/A    Number of Children: N/A  . Years of Education: N/A   Occupational History  . Not on file.   Social History Main Topics  . Smoking status: Current Every Day Smoker -- 0.50 packs/day for 50 years  . Smokeless tobacco: Not on file  . Alcohol Use: No  . Drug Use: No  . Sexual Activity: Not on file   Other Topics Concern  . Not on file   Social History Narrative  . No narrative on file    Family History  Problem Relation Age of Onset  . Hypertension Mother   . Heart disease Mother     before age 33   . Cancer  Father   . Diabetes Father   . Hypertension Father   . Hyperlipidemia Father   . Varicose Veins Father   . Heart attack Father   . Hypertension Sister   . Hypertension Brother   . Heart disease Brother     before age 22  . Heart attack Brother   . Hyperlipidemia Son   . Peripheral vascular disease Son     Current Outpatient Prescriptions on File Prior to Visit  Medication Sig Dispense Refill  . acetaminophen (TYLENOL) 500 MG tablet Take 500-1,000 mg by mouth every 4 (four) hours as needed for pain. For pain      . albuterol (PROVENTIL HFA;VENTOLIN HFA) 108 (90 BASE) MCG/ACT inhaler Inhale 2 puffs into the lungs 2 (two) times daily as needed for wheezing or shortness of breath. For wheezing      . ALPRAZolam (XANAX) 0.5 MG tablet Take 1 mg by mouth 2 (two) times daily.       Marland Kitchen aspirin EC 81 MG EC tablet Take 1 tablet (81 mg total) by mouth daily.  30 tablet  3  . budesonide-formoterol (SYMBICORT) 160-4.5 MCG/ACT inhaler Inhale 1 puff into the lungs daily.      Marland Kitchen esomeprazole (NEXIUM) 40 MG capsule Take 40  mg by mouth daily before breakfast.      . lisinopril (PRINIVIL,ZESTRIL) 10 MG tablet Take 10 mg by mouth every evening.      . metoprolol tartrate (LOPRESSOR) 25 MG tablet Take 12.5 mg by mouth 2 (two) times daily.      . nitroGLYCERIN (NITROSTAT) 0.4 MG SL tablet Place 1 tablet (0.4 mg total) under the tongue every 5 (five) minutes x 3 doses as needed for chest pain.  25 tablet  3  . omeprazole (PRILOSEC) 20 MG capsule Take 20 mg by mouth 2 (two) times daily.      . pravastatin (PRAVACHOL) 40 MG tablet Take 40 mg by mouth every evening.      . ranitidine (ZANTAC) 150 MG tablet Take 150 mg by mouth 2 (two) times daily.      . Ticagrelor (BRILINTA) 90 MG TABS tablet Take 1 tablet (90 mg total) by mouth 2 (two) times daily.  60 tablet  11   No current facility-administered medications on file prior to visit.    Allergies  Allergen Reactions  . Ciprofloxacin Anaphylaxis  . Codeine  Other (See Comments)    Unknown reaction  . Erythromycin Nausea And Vomiting  . Penicillins Rash  . Sulfa Antibiotics Rash    REVIEW OF SYSTEMS:  (Positives checked otherwise negative)  CARDIOVASCULAR:  [x]  chest pain, [ ]  chest pressure, [ ]  palpitations, [x]  shortness of breath when laying flat, [x]  shortness of breath with exertion,   [x]  pain in feet when walking, [x]  pain in feet when laying flat, [ ]  history of blood clot in veins (DVT), [ ]  history of phlebitis, [x]  swelling in legs, [ ]  varicose veins  PULMONARY:  [ ]  productive cough, [ ]  asthma, [x]  wheezing  NEUROLOGIC:  [x]  weakness in arms or legs, [x]  numbness in arms or legs, [ ]  difficulty speaking or slurred speech, [ ]  temporary loss of vision in one eye, [x]  dizziness  HEMATOLOGIC:  [ ]  bleeding problems, [ ]  problems with blood clotting too easily  MUSCULOSKEL:  [ ]  joint pain, [ ]  joint swelling  GASTROINTEST:  [ ]   Vomiting blood, [ ]   Blood in stool     GENITOURINARY:  [ ]   Burning with urination, [ ]   Blood in urine  PSYCHIATRIC:  [x]  history of major depression  INTEGUMENTARY:  [ ]  rashes, [ ]  ulcers  CONSTITUTIONAL:  [ ]  fever, [ ]  chills  For VQI Use Only  PRE-ADM LIVING: Home  AMB STATUS: Ambulatory  CAD Sx: History of MI, but no symptoms No MI within 6 months  PRIOR CHF: None  STRESS TEST: [x]  No, [ ]  Normal, [ ]  + ischemia, [ ]  + MI, [ ]  Both   Physical Examination Filed Vitals:   01/31/14 1002  BP: 122/72  Pulse: 72  Height: 5\' 4"  (1.626 m)  Weight: 185 lb (83.915 kg)  SpO2: 100%   Body mass index is 31.74 kg/(m^2).  General: A&O x 3, WDWN, appears older than stated age  Head: [place]/AT  Ear/Nose/Throat: Hearing grossly intact, nares w/o erythema or drainage, oropharynx w/o Erythema/Exudate  Eyes: PERRLA, EOMI  Neck: Supple, no nuchal rigidity, no palpable LAD  Pulmonary: Sym exp, good air movt, CTAB, no rales, rhonchi, & wheezing  Cardiac: RRR, Nl S1, S2, no Murmurs, rubs or  gallops  Vascular: Vessel Right Left  Radial Faintly Palpable Faintly Palpable  Brachial Faintly Palpable Palpable  Carotid Palpable, without bruit Palpable, without bruit  Aorta Not palpable  N/A  Femoral Palpable Palpable  Popliteal Not palpable Not palpable  PT Not Palpable Palpable  DP Not Palpable Palpable   Gastrointestinal: soft, NTND, -G/R, - HSM, - masses, - CVAT B  Musculoskeletal: M/S 5/5 throughout , Extremities without ischemic changes , cyanotic feet  Neurologic: CN 2-12 intact , Pain and light touch intact in extremities except somewhat decreased in L foot , Motor exam as listed above  Psychiatric: Judgment intact, Mood & affect appropriatefor pt's clinical situation  Dermatologic: See M/S exam for extremity exam, no rashes otherwise noted  Lymph : No Cervical, Axillary, or Inguinal lymphadenopathy   Non-Invasive Vascular Imaging  Outside ABI (Date: 01/16/14)  R: 0.89, PT biphasic, TBI 0.52  L: 1.03, DP: tri, PT: tri, TBI: 0.86  RLE arterial duplex (01/31/2014)  Monophasic waveforms throughout  No hemodynamically significant stenoses found  Possible inflow disease  Outside Studies/Documentation 6 pages of outside documents were reviewed including: outside BLE physiologic study.  Medical Decision Making  DENAI CABA is a 63 y.o. 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  Her RLE studies conflict with each other.  The study today is concerning for interval right iliac system occlusion, so I recommended proceeding with an Aortogram, bilateral leg runoff, and possible right iliac intervention.  This is scheduled for this coming Thursday. I discussed with the patient the nature of angiographic procedures, especially the limited patencies of any endovascular  intervention.  The patient is aware of that the risks of an angiographic procedure include but are not limited to: bleeding, infection, access site complications, renal failure, embolization, rupture of vessel, dissection, possible need for emergent surgical intervention, possible need for surgical procedures to treat the patient's pathology, anaphylactic reaction to contrast, and stroke and death.  The patient is aware of the risks and agrees to proceed.  I discussed in depth with the patient the nature of atherosclerosis, and emphasized the importance of maximal medical management including strict control of blood pressure, blood glucose, and lipid levels, antiplatelet agent, obtaining regular exercise, and cessation of smoking.   The patient is currently on a statin: ASA. The patient is currently on an anti-platelet: Lipitor. After her angiographic studies are completed, I would obtain a BLE venous insufficiency duplex to evaluate the valves in her legs, as the patient continues to fixate on the cyanosis in her feet.  Thank you for allowing [place] to participate in this patient's care.  Adele Barthel, MD [facility] Office: [phone] Pager: [phone]  01/31/2014, 10:31 AM

## 2014-02-06 NOTE — Discharge Instructions (Signed)

## 2014-02-06 NOTE — Interval H&P Note (Signed)
[facility]  History and Physical Update  The patient was interviewed and re-examined.  The patient's previous History and Physical has been reviewed and is unchanged from my consult.  There is no change in the plan of care: Ao, BRo, and possible intervention right iliac artery.  Adele Barthel, MD [facility] Office: [phone] Pager: [phone]  02/06/2014, 7:23 AM

## 2014-02-06 NOTE — Op Note (Signed)
OPERATIVE NOTE   PROCEDURE: 1.  Left common femoral artery cannulation under ultrasound guidance 2.  Placement of catheter in aorta 3.  Aortogram 4.  Bilateral leg runoff  PRE-OPERATIVE DIAGNOSIS: possible right iliac occlusion  POST-OPERATIVE DIAGNOSIS: same as above   SURGEON: Adele Barthel, MD  ANESTHESIA: conscious sedation  ESTIMATED BLOOD LOSS: 50 cc  CONTRAST: 120 cc  FINDING(S):  Aorta: patent  Superior mesenteric artery: patent Celiac artery: patent   Right Left  RA patent patent  CIA Patent with >90% stenosis Patent, >30% calcified orifical stenosis (7 mm Hg gradient)  EIA Patent Patent  IIA Patent Patent  CFA Patent Patent  SFA Patent Patent  PFA Patent Patent  Pop Patent Patent  Trif Patent Patent  AT Patent with high takeoff near knee Patent with high takeoff near knee.  Pero Patent Patent  PT Patent Patent   SPECIMEN(S):  none  INDICATIONS:   [name] is a 63 y.o. female who presents with bilateral leg pain not consistent with non-invasive testing.  She had two sets of ABIs which were not in agreement and the most recent ABI suggested right inflow occlusion.  The patient presents for: aortogram, bilateral leg runoff, and possible intervention.  I discussed with the patient the nature of angiographic procedures, especially the limited patencies of any endovascular intervention.  The patient is aware of that the risks of an angiographic procedure include but are not limited to: bleeding, infection, access site complications, renal failure, embolization, rupture of vessel, dissection, possible need for emergent surgical intervention, possible need for surgical procedures to treat the patient's pathology, and stroke and death.  The patient is aware of the risks and agrees to proceed.  DESCRIPTION: After full informed consent was obtained from the patient, the patient was brought back to the angiography suite.  The patient was placed supine upon the  angiography table and connected to monitoring equipment.  The patient was then given conscious sedation, the amounts of which are documented in the patient's chart.  The patient was prepped and drape in the standard fashion for an angiographic procedure.  At this point, attention was turned to the right groin.  Under ultrasound guidance, the left common femoral artery was cannulated with a micropuncture needle.  The microwire was advanced into the iliac arterial system.  The needle was exchanged for a microsheath, which was loaded into the common femoral artery over the wire.  The microwire was exchanged for a [facility] wire which was advanced into the aorta.  The microsheath was then exchanged for a 5-Fr sheath which was loaded into the common femoral artery.  The Omniflush catheter was then loaded over the wire up to the level of L1.  The catheter was connected to the power injector circuit.  After de-airring and de-clotting the circuit, a power injector aortogram was completed. I pulled the catheter down to proximal to the aortic bifurcation and did a magnified pelvic injections.  Based on the measurements, both common iliac artery are ~7 mm but the distal aorta in only ~10.5 mm.  A kissing stent technique would be need in this case, so I doubt this patient can be treated with stents.  There is already evidence of calcification of the bifurcation and common iliac arteries, so placing two stents might result in rupture of the distal aorta.  At this point, I completed an automated bilateral leg runoff.  There appears to be no disease distally.  At this point, I placed the [facility]  wire through the catheter and exchange the catheter for an endhole catheter.  I dragged a gradient across the left common iliac artery.  Only a 7 mm Hg gradient was recorded.  I pulled out the catheter.  The sheath was aspirated.  No clots were present and the sheath was reloaded with heparinized saline.    COMPLICATIONS: none  CONDITION:  stable  Adele Barthel, MD [facility] Office: [phone] Pager: [phone]  02/06/2014, 9:22 AM

## 2014-02-07 ENCOUNTER — Telehealth: Payer: Self-pay | Admitting: Vascular Surgery

## 2014-02-07 NOTE — Telephone Encounter (Addendum)
Message copied by Doristine Section on Fri Feb 07, 2014  9:03 AM ------      Message from: Peter Minium K      Created: Thu Feb 06, 2014 10:55 AM      Regarding: Schedule                   ----- Message -----         From: Conrad Monserrate, MD         Sent: 02/06/2014   9:30 AM           To: [address] [place]      [id number]      05/30/1951            APROCEDURE:      1.  Left common femoral artery cannulation under ultrasound guidance      2.  Placement of catheter in aorta      3.  Aortogram      4.  Bilateral leg runoff            Follow-up: 2 weeks            Orders(s) for follow-up: Pt needs ASAP cardiac evaluation for possible fem-fem BPG       ------  notified patient of fu appt. with dr. Bridgett Larsson on 02-28-14 at 8:45 am and told her to call dr. Zenia Resides office to schedule a stress test.

## 2014-02-10 ENCOUNTER — Other Ambulatory Visit (HOSPITAL_COMMUNITY): Payer: Self-pay | Admitting: Cardiology

## 2014-02-10 DIAGNOSIS — R079 Chest pain, unspecified: Secondary | ICD-10-CM

## 2014-02-12 ENCOUNTER — Telehealth: Payer: Self-pay

## 2014-02-12 NOTE — Telephone Encounter (Signed)
Message copied by Denman George on Wed Feb 12, 2014  9:38 AM ------      Message from: Conrad [place]      Created: Wed Feb 12, 2014  8:13 AM      Regarding: RE: question about delaying surgery       Her choice.  She still has perfusion            ----- Message -----         From: Sherrye Payor, RN         Sent: 02/11/2014   4:50 PM           To: Conrad [place], MD      Subject: question about delaying surgery                          She called to ask if you feel it is okay for her to delay her Fem-Fem BP until mid August.  She has some commitments in the next several weeks, and then again in 1st week of August.  She would like to also delay her Nuclear Stress test, until closer to that time frame.  What do you recommend?       ------

## 2014-02-12 NOTE — Telephone Encounter (Signed)
Returned call to pt.  Informed of Dr. Lianne Moris response re: question of delaying her bypass surgery.  Stated she wants to post-pone her surgery as long as possible due to the financial burden.  Advised will cancel appt. with Dr. Bridgett Larsson 6/26, and will  have a scheduler call her to set up the stress test with Dr. Terrence Dupont, and appt. with Dr. Bridgett Larsson in late July.  Agrees with plan.

## 2014-02-14 NOTE — Telephone Encounter (Signed)
Spoke with patient to reschedule her appointment with Dr Bridgett Larsson to August. I will contact Dr Aaron Mose office closer to the end of July to schedule the stress test. Pt is in agreement with this, dpm

## 2014-02-24 ENCOUNTER — Ambulatory Visit (HOSPITAL_COMMUNITY): Payer: Medicare Other

## 2014-02-24 ENCOUNTER — Encounter (HOSPITAL_COMMUNITY): Payer: Medicare Other

## 2014-02-28 ENCOUNTER — Ambulatory Visit: Payer: Medicare Other | Admitting: Vascular Surgery

## 2014-03-19 ENCOUNTER — Telehealth: Payer: Self-pay | Admitting: Vascular Surgery

## 2014-03-19 NOTE — Telephone Encounter (Signed)
Left message for pt to cb. Need to know if she has rescheduled her appointment with Dr Zenia Resides office. She is due back to see [place] on 04/18/14. Will need pre-op by cards prior to that visit.

## 2014-03-20 ENCOUNTER — Telehealth: Payer: Self-pay | Admitting: Vascular Surgery

## 2014-03-20 NOTE — Telephone Encounter (Signed)
Ms [place] called back to ask what her appointment with Dr Bridgett Larsson was for exactly. I explained that Dr Bridgett Larsson would like for her to have a cardiac evaluation by the way of a Nuclear Stress test prior to seeing him. He would discuss with her the possibility of scheduling her fem-fem BPG procedure at the visit and answer any questions she may have. The reason for having the stress test was in preparation for vascular procedure.   Ms Stopper does not wish to have another Nuclear stress test before having some of her additional questions answered. She has questions about the necessity of this procedure, the outcome if she decided against surgery, and other treatment options. Due to having multiple health issues, a limited income situation and concern over having another stress test, it was decided that she should probably see Dr Bridgett Larsson again to discuss her medical diagnosis and reason for vascular procedure. Ms Ruacho would feel more comfortable speaking with Dr Bridgett Larsson before doing any other pre-operative visits with Dr Terrence Dupont.  Ms Starliper expressed that she trusted Dr Bridgett Larsson, as he has operated on her son; but now that she has had time to process everything she has thought of additional concerns/questions. She has been rescheduled to 04/25/14 to see [place] only for discussion. No other appointments have been made with cardiology.

## 2014-03-30 ENCOUNTER — Inpatient Hospital Stay (HOSPITAL_COMMUNITY)
Admission: EM | Admit: 2014-03-30 | Discharge: 2014-04-02 | DRG: 251 | Disposition: A | Discharge status: Home / Self Care | Payer: Medicare Other | Attending: Cardiology | Admitting: Cardiology

## 2014-03-30 ENCOUNTER — Encounter (HOSPITAL_COMMUNITY): Payer: Self-pay | Admitting: Emergency Medicine

## 2014-03-30 ENCOUNTER — Emergency Department (HOSPITAL_COMMUNITY): Payer: Medicare Other

## 2014-03-30 DIAGNOSIS — R079 Chest pain, unspecified: Secondary | ICD-10-CM

## 2014-03-30 DIAGNOSIS — I2582 Chronic total occlusion of coronary artery: Secondary | ICD-10-CM | POA: Diagnosis present

## 2014-03-30 DIAGNOSIS — I10 Essential (primary) hypertension: Secondary | ICD-10-CM | POA: Diagnosis present

## 2014-03-30 DIAGNOSIS — K219 Gastro-esophageal reflux disease without esophagitis: Secondary | ICD-10-CM | POA: Diagnosis present

## 2014-03-30 DIAGNOSIS — E7800 Pure hypercholesterolemia, unspecified: Secondary | ICD-10-CM | POA: Diagnosis present

## 2014-03-30 DIAGNOSIS — J449 Chronic obstructive pulmonary disease, unspecified: Secondary | ICD-10-CM | POA: Diagnosis present

## 2014-03-30 DIAGNOSIS — Z8249 Family history of ischemic heart disease and other diseases of the circulatory system: Secondary | ICD-10-CM

## 2014-03-30 DIAGNOSIS — G8929 Other chronic pain: Secondary | ICD-10-CM | POA: Diagnosis present

## 2014-03-30 DIAGNOSIS — I200 Unstable angina: Secondary | ICD-10-CM | POA: Diagnosis present

## 2014-03-30 DIAGNOSIS — J4489 Other specified chronic obstructive pulmonary disease: Secondary | ICD-10-CM | POA: Diagnosis present

## 2014-03-30 DIAGNOSIS — I2510 Atherosclerotic heart disease of native coronary artery without angina pectoris: Secondary | ICD-10-CM | POA: Diagnosis not present

## 2014-03-30 DIAGNOSIS — Z8674 Personal history of sudden cardiac arrest: Secondary | ICD-10-CM

## 2014-03-30 DIAGNOSIS — I252 Old myocardial infarction: Secondary | ICD-10-CM

## 2014-03-30 DIAGNOSIS — I739 Peripheral vascular disease, unspecified: Secondary | ICD-10-CM | POA: Diagnosis present

## 2014-03-30 DIAGNOSIS — Z7982 Long term (current) use of aspirin: Secondary | ICD-10-CM

## 2014-03-30 DIAGNOSIS — Z9861 Coronary angioplasty status: Secondary | ICD-10-CM

## 2014-03-30 DIAGNOSIS — M1990 Unspecified osteoarthritis, unspecified site: Secondary | ICD-10-CM | POA: Diagnosis present

## 2014-03-30 DIAGNOSIS — F17200 Nicotine dependence, unspecified, uncomplicated: Secondary | ICD-10-CM | POA: Diagnosis present

## 2014-03-30 DIAGNOSIS — M25559 Pain in unspecified hip: Secondary | ICD-10-CM | POA: Diagnosis present

## 2014-03-30 LAB — BASIC METABOLIC PANEL
ANION GAP: 15 (ref 5–15)
BUN: 16 mg/dL (ref 6–23)
CHLORIDE: 105 meq/L (ref 96–112)
CO2: 21 meq/L (ref 19–32)
Calcium: 9.3 mg/dL (ref 8.4–10.5)
Creatinine, Ser: 0.84 mg/dL (ref 0.50–1.10)
GFR calc non Af Amer: 71 mL/min — ABNORMAL LOW (ref >90)
GFR, EST AFRICAN AMERICAN: 83 mL/min — AB (ref >90)
Glucose, Bld: 105 mg/dL — ABNORMAL HIGH (ref 70–99)
POTASSIUM: 3.8 meq/L (ref 3.7–5.3)
SODIUM: 141 meq/L (ref 137–147)

## 2014-03-30 LAB — CBC
HCT: 43.4 % (ref 36.0–46.0)
Hemoglobin: 14.8 g/dL (ref 12.0–15.0)
MCH: 32.7 pg (ref 26.0–34.0)
MCHC: 34.1 g/dL (ref 30.0–36.0)
MCV: 95.8 fL (ref 78.0–100.0)
PLATELETS: 247 10*3/uL (ref 150–400)
RBC: 4.53 MIL/uL (ref 3.87–5.11)
RDW: 13.5 % (ref 11.5–15.5)
WBC: 11.5 10*3/uL — ABNORMAL HIGH (ref 4.0–10.5)

## 2014-03-30 LAB — PRO B NATRIURETIC PEPTIDE: Pro B Natriuretic peptide (BNP): 220.4 pg/mL — ABNORMAL HIGH (ref 0–125)

## 2014-03-30 LAB — I-STAT TROPONIN, ED: Troponin i, poc: 0 ng/mL (ref 0.00–0.08)

## 2014-03-30 MED ORDER — ASPIRIN 81 MG PO CHEW
324.0000 mg | CHEWABLE_TABLET | Freq: Once | ORAL | Status: AC
  Administered 2014-03-30: 324 mg via ORAL
  Filled 2014-03-30: qty 4

## 2014-03-30 MED ORDER — HYDROMORPHONE HCL PF 1 MG/ML IJ SOLN
0.5000 mg | Freq: Once | INTRAMUSCULAR | Status: DC
  Filled 2014-03-30: qty 1

## 2014-03-30 NOTE — ED Notes (Addendum)
C/o intermittant CP, pt of Dr. Terrence Dupont, "new blockage in R leg dx'd recently, sx ongoing for months, also reports radiation to L arm, diaphoresis, nausea, sob, dizziness. Sx worse this afternoon. Has had 3 ntg, no ASA PTA.

## 2014-03-31 DIAGNOSIS — J4489 Other specified chronic obstructive pulmonary disease: Secondary | ICD-10-CM | POA: Diagnosis not present

## 2014-03-31 DIAGNOSIS — Z8249 Family history of ischemic heart disease and other diseases of the circulatory system: Secondary | ICD-10-CM | POA: Diagnosis not present

## 2014-03-31 DIAGNOSIS — I252 Old myocardial infarction: Secondary | ICD-10-CM | POA: Diagnosis not present

## 2014-03-31 DIAGNOSIS — Z7982 Long term (current) use of aspirin: Secondary | ICD-10-CM | POA: Diagnosis not present

## 2014-03-31 DIAGNOSIS — K219 Gastro-esophageal reflux disease without esophagitis: Secondary | ICD-10-CM | POA: Diagnosis present

## 2014-03-31 DIAGNOSIS — M1990 Unspecified osteoarthritis, unspecified site: Secondary | ICD-10-CM | POA: Diagnosis present

## 2014-03-31 DIAGNOSIS — R079 Chest pain, unspecified: Secondary | ICD-10-CM | POA: Diagnosis present

## 2014-03-31 DIAGNOSIS — Z9861 Coronary angioplasty status: Secondary | ICD-10-CM | POA: Diagnosis not present

## 2014-03-31 DIAGNOSIS — E7800 Pure hypercholesterolemia, unspecified: Secondary | ICD-10-CM | POA: Diagnosis present

## 2014-03-31 DIAGNOSIS — M25559 Pain in unspecified hip: Secondary | ICD-10-CM | POA: Diagnosis present

## 2014-03-31 DIAGNOSIS — F17200 Nicotine dependence, unspecified, uncomplicated: Secondary | ICD-10-CM | POA: Diagnosis present

## 2014-03-31 DIAGNOSIS — Z8674 Personal history of sudden cardiac arrest: Secondary | ICD-10-CM | POA: Diagnosis not present

## 2014-03-31 DIAGNOSIS — G8929 Other chronic pain: Secondary | ICD-10-CM | POA: Diagnosis present

## 2014-03-31 DIAGNOSIS — I10 Essential (primary) hypertension: Secondary | ICD-10-CM | POA: Diagnosis present

## 2014-03-31 DIAGNOSIS — I200 Unstable angina: Secondary | ICD-10-CM | POA: Diagnosis present

## 2014-03-31 DIAGNOSIS — J449 Chronic obstructive pulmonary disease, unspecified: Secondary | ICD-10-CM | POA: Diagnosis present

## 2014-03-31 DIAGNOSIS — I739 Peripheral vascular disease, unspecified: Secondary | ICD-10-CM | POA: Diagnosis present

## 2014-03-31 DIAGNOSIS — I2510 Atherosclerotic heart disease of native coronary artery without angina pectoris: Secondary | ICD-10-CM | POA: Diagnosis present

## 2014-03-31 DIAGNOSIS — I2582 Chronic total occlusion of coronary artery: Secondary | ICD-10-CM | POA: Diagnosis present

## 2014-03-31 LAB — CBC WITH DIFFERENTIAL/PLATELET
BASOS PCT: 0 % (ref 0–1)
Basophils Absolute: 0 10*3/uL (ref 0.0–0.1)
EOS ABS: 0.1 10*3/uL (ref 0.0–0.7)
Eosinophils Relative: 2 % (ref 0–5)
HCT: 42.7 % (ref 36.0–46.0)
HEMOGLOBIN: 14.1 g/dL (ref 12.0–15.0)
Lymphocytes Relative: 27 % (ref 12–46)
Lymphs Abs: 2.5 10*3/uL (ref 0.7–4.0)
MCH: 32.3 pg (ref 26.0–34.0)
MCHC: 33 g/dL (ref 30.0–36.0)
MCV: 97.7 fL (ref 78.0–100.0)
MONOS PCT: 6 % (ref 3–12)
Monocytes Absolute: 0.6 10*3/uL (ref 0.1–1.0)
NEUTROS PCT: 65 % (ref 43–77)
Neutro Abs: 6.3 10*3/uL (ref 1.7–7.7)
Platelets: 235 10*3/uL (ref 150–400)
RBC: 4.37 MIL/uL (ref 3.87–5.11)
RDW: 13.5 % (ref 11.5–15.5)
WBC: 9.6 10*3/uL (ref 4.0–10.5)

## 2014-03-31 LAB — PROTIME-INR
INR: 0.95 (ref 0.00–1.49)
PROTHROMBIN TIME: 12.7 s (ref 11.6–15.2)

## 2014-03-31 LAB — COMPREHENSIVE METABOLIC PANEL
ALBUMIN: 3.6 g/dL (ref 3.5–5.2)
ALK PHOS: 114 U/L (ref 39–117)
ALT: 14 U/L (ref 0–35)
ANION GAP: 12 (ref 5–15)
AST: 14 U/L (ref 0–37)
BUN: 14 mg/dL (ref 6–23)
CO2: 23 mEq/L (ref 19–32)
CREATININE: 0.56 mg/dL (ref 0.50–1.10)
Calcium: 9.3 mg/dL (ref 8.4–10.5)
Chloride: 106 mEq/L (ref 96–112)
GFR calc Af Amer: >90 mL/min (ref >90)
GFR calc non Af Amer: >90 mL/min (ref >90)
GLUCOSE: 119 mg/dL — AB (ref 70–99)
POTASSIUM: 3.8 meq/L (ref 3.7–5.3)
Sodium: 141 mEq/L (ref 137–147)
Total Bilirubin: 0.3 mg/dL (ref 0.3–1.2)
Total Protein: 6.3 g/dL (ref 6.0–8.3)

## 2014-03-31 LAB — HEPARIN LEVEL (UNFRACTIONATED): Heparin Unfractionated: 0.29 IU/mL — ABNORMAL LOW (ref 0.30–0.70)

## 2014-03-31 LAB — TROPONIN I: Troponin I: <0.3 ng/mL (ref <0.30)

## 2014-03-31 LAB — MAGNESIUM: Magnesium: 1.9 mg/dL (ref 1.5–2.5)

## 2014-03-31 MED ORDER — HEPARIN (PORCINE) IN NACL 100-0.45 UNIT/ML-% IJ SOLN
1050.0000 [IU]/h | INTRAMUSCULAR | Status: DC
  Administered 2014-03-31: 1050 [IU]/h via INTRAVENOUS
  Filled 2014-03-31 (×2): qty 250

## 2014-03-31 MED ORDER — HEPARIN (PORCINE) IN NACL 100-0.45 UNIT/ML-% IJ SOLN
900.0000 [IU]/h | INTRAMUSCULAR | Status: DC
  Administered 2014-03-31: 900 [IU]/h via INTRAVENOUS
  Filled 2014-03-31: qty 250

## 2014-03-31 MED ORDER — ATORVASTATIN CALCIUM 40 MG PO TABS
40.0000 mg | ORAL_TABLET | Freq: Every day | ORAL | Status: DC
  Administered 2014-03-31 – 2014-04-01 (×2): 40 mg via ORAL
  Filled 2014-03-31 (×4): qty 1

## 2014-03-31 MED ORDER — NITROGLYCERIN 0.4 MG SL SUBL: 0.4000 mg | SUBLINGUAL_TABLET | SUBLINGUAL | Status: DC | PRN

## 2014-03-31 MED ORDER — METOPROLOL TARTRATE 25 MG PO TABS
25.0000 mg | ORAL_TABLET | Freq: Two times a day (BID) | ORAL | Status: DC
  Administered 2014-03-31 – 2014-04-02 (×4): 25 mg via ORAL
  Filled 2014-03-31 (×7): qty 1

## 2014-03-31 MED ORDER — NITROGLYCERIN 2 % TD OINT
0.5000 [in_us] | TOPICAL_OINTMENT | Freq: Four times a day (QID) | TRANSDERMAL | Status: DC
  Administered 2014-03-31 – 2014-04-02 (×6): 0.5 [in_us] via TOPICAL
  Filled 2014-03-31 (×3): qty 30

## 2014-03-31 MED ORDER — HEPARIN BOLUS VIA INFUSION
4000.0000 [IU] | INTRAVENOUS | Status: AC
  Administered 2014-03-31: 4000 [IU] via INTRAVENOUS
  Filled 2014-03-31: qty 4000

## 2014-03-31 MED ORDER — ASPIRIN 300 MG RE SUPP
300.0000 mg | RECTAL | Status: AC
  Filled 2014-03-31: qty 1

## 2014-03-31 MED ORDER — ASPIRIN 81 MG PO CHEW
324.0000 mg | CHEWABLE_TABLET | ORAL | Status: AC
  Administered 2014-03-31: 324 mg via ORAL
  Filled 2014-03-31: qty 4

## 2014-03-31 MED ORDER — ASPIRIN EC 81 MG PO TBEC
81.0000 mg | DELAYED_RELEASE_TABLET | Freq: Every day | ORAL | Status: DC
  Administered 2014-04-02: 81 mg via ORAL
  Filled 2014-03-31 (×2): qty 1

## 2014-03-31 MED ORDER — ASPIRIN 81 MG PO TABS: 81.0000 mg | ORAL_TABLET | Freq: Every day | ORAL | Status: DC

## 2014-03-31 MED ORDER — ALPRAZOLAM 0.25 MG PO TABS
0.2500 mg | ORAL_TABLET | Freq: Two times a day (BID) | ORAL | Status: DC | PRN
Start: 2014-03-31 — End: 2014-04-02
  Administered 2014-03-31 – 2014-04-02 (×6): 0.25 mg via ORAL
  Filled 2014-03-31 (×6): qty 1

## 2014-03-31 MED ORDER — PANTOPRAZOLE SODIUM 40 MG PO TBEC
40.0000 mg | DELAYED_RELEASE_TABLET | Freq: Every day | ORAL | Status: DC
  Administered 2014-03-31: 40 mg via ORAL
  Filled 2014-03-31: qty 1

## 2014-03-31 MED ORDER — ONDANSETRON HCL 4 MG/2ML IJ SOLN: 4.0000 mg | Freq: Four times a day (QID) | INTRAMUSCULAR | Status: DC | PRN

## 2014-03-31 MED ORDER — ACETAMINOPHEN 325 MG PO TABS
650.0000 mg | ORAL_TABLET | ORAL | Status: DC | PRN
Start: 2014-03-31 — End: 2014-04-01
  Administered 2014-03-31 – 2014-04-01 (×3): 650 mg via ORAL
  Filled 2014-03-31 (×3): qty 2

## 2014-03-31 MED ORDER — ALBUTEROL SULFATE (2.5 MG/3ML) 0.083% IN NEBU: 2.5000 mg | INHALATION_SOLUTION | Freq: Two times a day (BID) | RESPIRATORY_TRACT | Status: DC | PRN

## 2014-03-31 MED ORDER — BUDESONIDE-FORMOTEROL FUMARATE 160-4.5 MCG/ACT IN AERO
2.0000 | INHALATION_SPRAY | Freq: Two times a day (BID) | RESPIRATORY_TRACT | Status: DC
  Administered 2014-03-31 – 2014-04-02 (×3): 2 via RESPIRATORY_TRACT
  Filled 2014-03-31 (×2): qty 6

## 2014-03-31 MED ORDER — SODIUM CHLORIDE 0.9 % IV SOLN
INTRAVENOUS | Status: DC
  Administered 2014-03-31: 10 mL/h via INTRAVENOUS

## 2014-03-31 NOTE — Progress Notes (Signed)
ANTICOAGULATION CONSULT NOTE - Initial Consult  Pharmacy Consult for Heparin Indication: chest pain/ACS  Allergies  Allergen Reactions  . Ciprofloxacin Anaphylaxis  . Erythromycin Nausea And Vomiting  . Morphine And Related Other (See Comments)    Doesn't work for patient at all  . Codeine Other (See Comments)    Unknown reaction  . Penicillins Rash  . Sulfa Antibiotics Rash    Patient Measurements: Height: 5' 4.5" (163.8 cm) Weight: 178 lb 6.4 oz (80.922 kg) IBW/kg (Calculated) : 55.85 Heparin Dosing Weight: 73 kg  Vital Signs: Temp: 97.9 F (36.6 C) (07/27 0532) Temp src: Oral (07/27 0532) BP: 98/52 mmHg (07/27 0532) Pulse Rate: 70 (07/27 0532)  Labs:  Recent Labs  03/30/14 2152  HGB 14.8  HCT 43.4  PLT 247  CREATININE 0.84    Estimated Creatinine Clearance: 69.5 ml/min (by C-G formula based on Cr of 0.84).   Medical History: Past Medical History  Diagnosis Date  . Hypertension   . GERD (gastroesophageal reflux disease)   . Hypercholesteremia   . ST elevation myocardial infarction (STEMI) of inferoposterior wall September 2013    S/P DES X 3 RCA, EF 50%  . Chronic hip pain   . Arthritis   . H/O hiatal hernia   . CAD (coronary artery disease)     Medications:  Prescriptions prior to admission  Medication Sig Dispense Refill  . acetaminophen (TYLENOL) 500 MG tablet Take 500-1,000 mg by mouth every 4 (four) hours as needed for pain. For pain      . albuterol (PROVENTIL HFA;VENTOLIN HFA) 108 (90 BASE) MCG/ACT inhaler Inhale 2 puffs into the lungs 2 (two) times daily as needed for wheezing or shortness of breath. For wheezing      . alprazolam (XANAX) 2 MG tablet Take 0.5-1 mg by mouth 2 (two) times daily as needed for anxiety.       Marland Kitchen amLODipine (NORVASC) 10 MG tablet Take 1 tablet by mouth every morning.       Marland Kitchen aspirin 81 MG tablet Take 81 mg by mouth daily.      Marland Kitchen atorvastatin (LIPITOR) 40 MG tablet Take 1 tablet by mouth daily at 6 PM.       .  budesonide-formoterol (SYMBICORT) 160-4.5 MCG/ACT inhaler Inhale 2 puffs into the lungs 2 (two) times daily.       Marland Kitchen esomeprazole (NEXIUM) 40 MG capsule Take 40 mg by mouth 2 (two) times daily.       Marland Kitchen lisinopril (PRINIVIL,ZESTRIL) 10 MG tablet Take 10 mg by mouth every evening.      . metoprolol tartrate (LOPRESSOR) 25 MG tablet Take 25 mg by mouth 2 (two) times daily.       . nitroGLYCERIN (NITROSTAT) 0.4 MG SL tablet Place 1 tablet (0.4 mg total) under the tongue every 5 (five) minutes x 3 doses as needed for chest pain.  25 tablet  3  . Polyethyl Glycol-Propyl Glycol (SYSTANE) 0.4-0.3 % SOLN Apply 1 drop to eye daily as needed (for dry eyes).      . ranitidine (ZANTAC) 150 MG tablet Take 150 mg by mouth daily as needed for heartburn.        Scheduled:  . aspirin  324 mg Oral NOW   Or  . aspirin  300 mg Rectal NOW  . [START ON 04/01/2014] aspirin EC  81 mg Oral Daily  . aspirin  81 mg Oral Daily  . atorvastatin  40 mg Oral q1800  . budesonide-formoterol  2 puff Inhalation  BID  .  HYDROmorphone (DILAUDID) injection  0.5 mg Intravenous Once  . metoprolol tartrate  25 mg Oral BID  . nitroGLYCERIN  0.5 inch Topical 4 times per day  . pantoprazole  40 mg Oral Daily    Assessment: 63 y.o female to begin IV heparin infusion for recurrent chest pain/ACS. Coronary artery disease history of ventral posterior wall myocardial infarction status post PCI to RCA in the past as well as h/o cardiac arrest, HTN, HLD.  Other PMH as noted above.  Patient is not on oral anticoagulant prior to this admission.  No bleeding reported.    Goal of Therapy:  Heparin level 0.3-0.7 units/ml Monitor platelets by anticoagulation protocol: Yes   Plan:  Heparin 4000 units IV bolus x 1 Heparin drip 900 units/hr 6 hr Heparin level Daily AM heparin level and CBC.    Nicole Cella, RPh Clinical Pharmacist Pager: [phone] 03/31/2014,8:27 AM

## 2014-03-31 NOTE — Progress Notes (Addendum)
ANTICOAGULATION CONSULT NOTE - Initial Consult  Pharmacy Consult for Heparin Indication: chest pain/ACS  Allergies  Allergen Reactions  . Ciprofloxacin Anaphylaxis  . Erythromycin Nausea And Vomiting  . Morphine And Related Other (See Comments)    Doesn't work for patient at all  . Codeine Other (See Comments)    Unknown reaction  . Penicillins Rash  . Sulfa Antibiotics Rash    Patient Measurements: Height: 5' 4.5" (163.8 cm) Weight: 178 lb 6.4 oz (80.922 kg) IBW/kg (Calculated) : 55.85 Heparin Dosing Weight: 73 kg  Vital Signs: Temp: 97.7 F (36.5 C) (07/27 1318) Temp src: Oral (07/27 1318) BP: 103/54 mmHg (07/27 1318) Pulse Rate: 72 (07/27 1318)  Labs:  Recent Labs  03/30/14 2152 03/31/14 0945 03/31/14 1344 03/31/14 1705  HGB 14.8 14.1  --   --   HCT 43.4 42.7  --   --   PLT 247 235  --   --   LABPROT  --  12.7  --   --   INR  --  0.95  --   --   HEPARINUNFRC  --   --   --  0.29*  CREATININE 0.84 0.56  --   --   TROPONINI  --  <0.30 <0.30  --     Estimated Creatinine Clearance: 72.9 ml/min (by C-G formula based on Cr of 0.56).   Medical History: Past Medical History  Diagnosis Date  . Hypertension   . GERD (gastroesophageal reflux disease)   . Hypercholesteremia   . ST elevation myocardial infarction (STEMI) of inferoposterior wall September 2013    S/P DES X 3 RCA, EF 50%  . Chronic hip pain   . Arthritis   . H/O hiatal hernia   . CAD (coronary artery disease)     Medications:  Prescriptions prior to admission  Medication Sig Dispense Refill  . acetaminophen (TYLENOL) 500 MG tablet Take 500-1,000 mg by mouth every 4 (four) hours as needed for pain. For pain      . albuterol (PROVENTIL HFA;VENTOLIN HFA) 108 (90 BASE) MCG/ACT inhaler Inhale 2 puffs into the lungs 2 (two) times daily as needed for wheezing or shortness of breath. For wheezing      . alprazolam (XANAX) 2 MG tablet Take 0.5-1 mg by mouth 2 (two) times daily as needed for anxiety.        Marland Kitchen amLODipine (NORVASC) 10 MG tablet Take 1 tablet by mouth every morning.       Marland Kitchen aspirin 81 MG tablet Take 81 mg by mouth daily.      Marland Kitchen atorvastatin (LIPITOR) 40 MG tablet Take 1 tablet by mouth daily at 6 PM.       . budesonide-formoterol (SYMBICORT) 160-4.5 MCG/ACT inhaler Inhale 2 puffs into the lungs 2 (two) times daily.       Marland Kitchen esomeprazole (NEXIUM) 40 MG capsule Take 40 mg by mouth 2 (two) times daily.       Marland Kitchen lisinopril (PRINIVIL,ZESTRIL) 10 MG tablet Take 10 mg by mouth every evening.      . metoprolol tartrate (LOPRESSOR) 25 MG tablet Take 25 mg by mouth 2 (two) times daily.       . nitroGLYCERIN (NITROSTAT) 0.4 MG SL tablet Place 1 tablet (0.4 mg total) under the tongue every 5 (five) minutes x 3 doses as needed for chest pain.  25 tablet  3  . Polyethyl Glycol-Propyl Glycol (SYSTANE) 0.4-0.3 % SOLN Apply 1 drop to eye daily as needed (for dry eyes).      Marland Kitchen  ranitidine (ZANTAC) 150 MG tablet Take 150 mg by mouth daily as needed for heartburn.        Scheduled:  . [START ON 04/01/2014] aspirin EC  81 mg Oral Daily  . atorvastatin  40 mg Oral q1800  . budesonide-formoterol  2 puff Inhalation BID  .  HYDROmorphone (DILAUDID) injection  0.5 mg Intravenous Once  . metoprolol tartrate  25 mg Oral BID  . nitroGLYCERIN  0.5 inch Topical 4 times per day  . pantoprazole  40 mg Oral Daily    Assessment: 63 y.o female to begin IV heparin infusion for recurrent chest pain/ACS. Coronary artery disease history of ventral posterior wall myocardial infarction status post PCI to RCA in the past as well as h/o cardiac arrest, HTN, HLD.  Other PMH as noted above.  Patient is not on oral anticoagulant prior to this admission.  First level came back close to therapeutic. Will adjust rate.    Goal of Therapy:  Heparin level 0.3-0.7 units/ml Monitor platelets by anticoagulation protocol: Yes   Plan:   Increase heparin to 1050 units/hr F/u with AM level

## 2014-03-31 NOTE — ED Provider Notes (Signed)
CSN: [id number]     Arrival date & time 03/30/14  2123 History   First MD Initiated Contact with Patient 03/30/14 2148     Chief Complaint  Patient presents with  . Chest Pain  . Shortness of Breath  . Nausea  . Dizziness     (Consider location/radiation/quality/duration/timing/severity/associated sxs/prior Treatment) Patient is a 63 y.o. female presenting with chest pain.  Chest Pain Pain location:  Substernal area Pain quality: burning and pressure   Pain radiates to:  Does not radiate Pain radiates to the back: no   Pain severity:  Moderate Onset quality:  Gradual Duration:  3 weeks Timing:  Constant Progression:  Waxing and waning Chronicity:  Recurrent Relieved by:  Nitroglycerin Associated symptoms: diaphoresis and nausea   Associated symptoms: no abdominal pain, no back pain, no cough, no fever, no headache, no near-syncope, no numbness, no shortness of breath and not vomiting   Risk factors: coronary artery disease and hypertension   Risk factors: no diabetes mellitus     Past Medical History  Diagnosis Date  . Hypertension   . GERD (gastroesophageal reflux disease)   . Hypercholesteremia   . ST elevation myocardial infarction (STEMI) of inferoposterior wall September 2013    S/P DES X 3 RCA, EF 50%  . Chronic hip pain   . Arthritis   . H/O hiatal hernia   . CAD (coronary artery disease)    Past Surgical History  Procedure Laterality Date  . Tonsillectomy    . Appendectomy    . Vulvectomy    . Breast surgery    . Cholecystectomy N/A 12/31/2012    Procedure: LAPAROSCOPIC CHOLECYSTECTOMY;  Surgeon: Harl Bowie, MD;  Location: [facility];  Service: General;  Laterality: N/A;  . Tubal ligation     Family History  Problem Relation Age of Onset  . Hypertension Mother   . Heart disease Mother     before age 51   . Cancer Father   . Diabetes Father   . Hypertension Father   . Hyperlipidemia Father   . Varicose Veins Father   . Heart attack Father   .  Hypertension Sister   . Hypertension Brother   . Heart disease Brother     before age 38  . Heart attack Brother   . Hyperlipidemia Son   . Peripheral vascular disease Son    History  Substance Use Topics  . Smoking status: Current Every Day Smoker -- 0.50 packs/day for 50 years  . Smokeless tobacco: Not on file  . Alcohol Use: No   OB History   Grav Para Term Preterm Abortions TAB SAB Ect Mult Living                 Review of Systems  Constitutional: Positive for diaphoresis. Negative for fever and chills.  HENT: Negative for sore throat.   Eyes: Negative for pain.  Respiratory: Negative for cough and shortness of breath.   Cardiovascular: Positive for chest pain. Negative for near-syncope.  Gastrointestinal: Positive for nausea. Negative for vomiting, abdominal pain and diarrhea.  Genitourinary: Negative for dysuria.  Musculoskeletal: Negative for back pain.  Skin: Negative for rash.  Neurological: Negative for numbness and headaches.      Allergies  Ciprofloxacin; Erythromycin; Morphine and related; Codeine; Penicillins; and Sulfa antibiotics  Home Medications   Prior to Admission medications   Medication Sig Start Date End Date Taking? Authorizing Provider  acetaminophen (TYLENOL) 500 MG tablet Take 500-1,000 mg by mouth every 4 (  four) hours as needed for pain. For pain   Yes Historical Provider, MD  albuterol (PROVENTIL HFA;VENTOLIN HFA) 108 (90 BASE) MCG/ACT inhaler Inhale 2 puffs into the lungs 2 (two) times daily as needed for wheezing or shortness of breath. For wheezing   Yes Historical Provider, MD  alprazolam Duanne Moron) 2 MG tablet Take 0.5-1 mg by mouth 2 (two) times daily as needed for anxiety.    Yes Historical Provider, MD  amLODipine (NORVASC) 10 MG tablet Take 1 tablet by mouth every morning.  01/22/14  Yes Historical Provider, MD  aspirin 81 MG tablet Take 81 mg by mouth daily.   Yes Historical Provider, MD  atorvastatin (LIPITOR) 40 MG tablet Take 1  tablet by mouth daily at 6 PM.  01/22/14  Yes Historical Provider, MD  budesonide-formoterol (SYMBICORT) 160-4.5 MCG/ACT inhaler Inhale 2 puffs into the lungs 2 (two) times daily.    Yes Historical Provider, MD  esomeprazole (NEXIUM) 40 MG capsule Take 40 mg by mouth 2 (two) times daily.    Yes Historical Provider, MD  lisinopril (PRINIVIL,ZESTRIL) 10 MG tablet Take 10 mg by mouth every evening.   Yes Historical Provider, MD  metoprolol tartrate (LOPRESSOR) 25 MG tablet Take 25 mg by mouth 2 (two) times daily.    Yes Historical Provider, MD  nitroGLYCERIN (NITROSTAT) 0.4 MG SL tablet Place 1 tablet (0.4 mg total) under the tongue every 5 (five) minutes x 3 doses as needed for chest pain. 05/27/12  Yes Clent Demark, MD  Polyethyl Glycol-Propyl Glycol (SYSTANE) 0.4-0.3 % SOLN Apply 1 drop to eye daily as needed (for dry eyes).   Yes Historical Provider, MD  ranitidine (ZANTAC) 150 MG tablet Take 150 mg by mouth daily as needed for heartburn.    Yes Historical Provider, MD   BP 122/70  Pulse 80  Temp(Src) 97.9 F (36.6 C) (Oral)  Resp 18  Ht 5' 4.5" (1.638 m)  Wt 178 lb 6.4 oz (80.922 kg)  BMI 30.16 kg/m2  SpO2 98% Physical Exam  Constitutional: She is oriented to person, place, and time. She appears well-developed and well-nourished. No distress.  HENT:  Head: Normocephalic and atraumatic.  Eyes: Pupils are equal, round, and reactive to light. Right eye exhibits no discharge. Left eye exhibits no discharge.  Neck: Normal range of motion.  Cardiovascular: Regular rhythm and normal heart sounds.  Tachycardia present.   Pulmonary/Chest: Effort normal and breath sounds normal.  Abdominal: Soft. She exhibits no distension. There is no tenderness.  Musculoskeletal: Normal range of motion.  Neurological: She is alert and oriented to person, place, and time.  Skin: Skin is warm. She is not diaphoretic.    ED Course  Procedures (including critical care time) Labs Review Labs Reviewed  PRO B  NATRIURETIC PEPTIDE - Abnormal; Notable for the following:    Pro B Natriuretic peptide (BNP) 220.4 (*)    All other components within normal limits  CBC - Abnormal; Notable for the following:    WBC 11.5 (*)    All other components within normal limits  BASIC METABOLIC PANEL - Abnormal; Notable for the following:    Glucose, Bld 105 (*)    GFR calc non Af Amer 71 (*)    GFR calc Af Amer 83 (*)    All other components within normal limits  COMPREHENSIVE METABOLIC PANEL - Abnormal; Notable for the following:    Glucose, Bld 119 (*)    All other components within normal limits  TROPONIN I  PROTIME-INR  CBC WITH DIFFERENTIAL  MAGNESIUM  TROPONIN I  TROPONIN I  HEPARIN LEVEL (UNFRACTIONATED)  Randolm Idol, ED    Imaging Review Dg Chest Port 1 View  03/30/2014   CLINICAL DATA:  Chest pain and left arm numbness.  EXAM: PORTABLE CHEST - 1 VIEW  COMPARISON:  None.  FINDINGS: Cardiac silhouette is unremarkable for this low inspiratory portable examination with crowded vasculature markings. Mildly calcified aortic knob. The lungs are clear without pleural effusions or focal consolidations. Trachea projects midline and there is no pneumothorax. Included soft tissue planes and osseous structures are non-suspicious. Mild dextroscoliosis.  IMPRESSION: No acute cardiopulmonary process for this low inspiratory portable examination.   Electronically Signed   By: Elon Alas   On: 03/30/2014 22:33     EKG Interpretation None      MDM   Final diagnoses:  Chest pain, unspecified chest pain type   63 yo F with hx of STEMI in 05/2012, followed by Dr. Terrence Dupont, who presents for 3 weeks of worsening chest pain, with episode of nausea, diaphoresis accompanying chest pain today.   HDS upon arrival, AFVSS. Tachycardia, complaining of mild chest pain, refusing pain medications. Already taken nitro x3, 325 ASA. Concern for unstable angina. Will admit with cardiology. Consulted with Dr. Terrence Dupont,  who agrees to admit the patient for continued management / evaluation. Admitted in stable condition. Patient seen and evaluated by myself and my attending, Dr. Stark Jock.      Freddi Che, MD 03/31/14 [phone]

## 2014-03-31 NOTE — H&P (Signed)
[name] is an 63 y.o. female.   Chief Complaint: Recurrent retrosternal chest pain HPI: Patient is 63 year old female with past medical history significant for coronary artery disease history of inferoposterior wall myocardial infarction in September of 2013 status post a primary PCI 200% occluded RCA history of V. fib arrest, hypertension, hypercholesteremia, COPD, history of tobacco abuse, positive family history of coronary artery disease, peripheral vascular disease, came to the ER complaining of recurrent retrosternal chest pain described as burning occasionally radiating to left arm lasting from a few minutes to a few hours associated with mild shortness of breath diaphoresis and nausea. Patient also states she gets occasional her numbness in her lower teeth hurt during chest pain. Patient took 3 sublingual nitroglycerin yesterday without much relief so decided to come to the ED. Patient states this chest pain is going on for last few months of and on  refused stress test in the past but as  pain got worse yesterday so decided to come to the ED. Patient also gives history of claudication pain recently had peripheral angiogram and was advised for fem-fem bypass .  Past Medical History  Diagnosis Date  . Hypertension   . GERD (gastroesophageal reflux disease)   . Hypercholesteremia   . ST elevation myocardial infarction (STEMI) of inferoposterior wall September 2013    S/P DES X 3 RCA, EF 50%  . Chronic hip pain   . Arthritis   . H/O hiatal hernia   . CAD (coronary artery disease)     Past Surgical History  Procedure Laterality Date  . Tonsillectomy    . Appendectomy    . Vulvectomy    . Breast surgery    . Cholecystectomy N/A 12/31/2012    Procedure: LAPAROSCOPIC CHOLECYSTECTOMY;  Surgeon: Harl Bowie, MD;  Location: [facility];  Service: General;  Laterality: N/A;  . Tubal ligation      Family History  Problem Relation Age of Onset  . Hypertension Mother   . Heart disease  Mother     before age 38   . Cancer Father   . Diabetes Father   . Hypertension Father   . Hyperlipidemia Father   . Varicose Veins Father   . Heart attack Father   . Hypertension Sister   . Hypertension Brother   . Heart disease Brother     before age 53  . Heart attack Brother   . Hyperlipidemia Son   . Peripheral vascular disease Son    Social History:  reports that she has been smoking.  She does not have any smokeless tobacco history on file. She reports that she does not drink alcohol or use illicit drugs.  Allergies:  Allergies  Allergen Reactions  . Ciprofloxacin Anaphylaxis  . Erythromycin Nausea And Vomiting  . Morphine And Related Other (See Comments)    Doesn't work for patient at all  . Codeine Other (See Comments)    Unknown reaction  . Penicillins Rash  . Sulfa Antibiotics Rash    Medications Prior to Admission  Medication Sig Dispense Refill  . acetaminophen (TYLENOL) 500 MG tablet Take 500-1,000 mg by mouth every 4 (four) hours as needed for pain. For pain      . albuterol (PROVENTIL HFA;VENTOLIN HFA) 108 (90 BASE) MCG/ACT inhaler Inhale 2 puffs into the lungs 2 (two) times daily as needed for wheezing or shortness of breath. For wheezing      . alprazolam (XANAX) 2 MG tablet Take 0.5-1 mg by mouth 2 (two)  times daily as needed for anxiety.       Marland Kitchen amLODipine (NORVASC) 10 MG tablet Take 1 tablet by mouth every morning.       Marland Kitchen aspirin 81 MG tablet Take 81 mg by mouth daily.      Marland Kitchen atorvastatin (LIPITOR) 40 MG tablet Take 1 tablet by mouth daily at 6 PM.       . budesonide-formoterol (SYMBICORT) 160-4.5 MCG/ACT inhaler Inhale 2 puffs into the lungs 2 (two) times daily.       Marland Kitchen esomeprazole (NEXIUM) 40 MG capsule Take 40 mg by mouth 2 (two) times daily.       Marland Kitchen lisinopril (PRINIVIL,ZESTRIL) 10 MG tablet Take 10 mg by mouth every evening.      . metoprolol tartrate (LOPRESSOR) 25 MG tablet Take 25 mg by mouth 2 (two) times daily.       . nitroGLYCERIN  (NITROSTAT) 0.4 MG SL tablet Place 1 tablet (0.4 mg total) under the tongue every 5 (five) minutes x 3 doses as needed for chest pain.  25 tablet  3  . Polyethyl Glycol-Propyl Glycol (SYSTANE) 0.4-0.3 % SOLN Apply 1 drop to eye daily as needed (for dry eyes).      . ranitidine (ZANTAC) 150 MG tablet Take 150 mg by mouth daily as needed for heartburn.         Results for orders placed during the hospital encounter of 03/30/14 (from the past 48 hour(s))  PRO B NATRIURETIC PEPTIDE     Status: Abnormal   Collection Time    03/30/14  9:50 PM      Result Value Ref Range   Pro B Natriuretic peptide (BNP) 220.4 (*) 0 - 125 pg/mL  CBC     Status: Abnormal   Collection Time    03/30/14  9:52 PM      Result Value Ref Range   WBC 11.5 (*) 4.0 - 10.5 K/uL   RBC 4.53  3.87 - 5.11 MIL/uL   Hemoglobin 14.8  12.0 - 15.0 g/dL   HCT 43.4  36.0 - 46.0 %   MCV 95.8  78.0 - 100.0 fL   MCH 32.7  26.0 - 34.0 pg   MCHC 34.1  30.0 - 36.0 g/dL   RDW 13.5  11.5 - 15.5 %   Platelets 247  150 - 400 K/uL  BASIC METABOLIC PANEL     Status: Abnormal   Collection Time    03/30/14  9:52 PM      Result Value Ref Range   Sodium 141  137 - 147 mEq/L   Potassium 3.8  3.7 - 5.3 mEq/L   Chloride 105  96 - 112 mEq/L   CO2 21  19 - 32 mEq/L   Glucose, Bld 105 (*) 70 - 99 mg/dL   BUN 16  6 - 23 mg/dL   Creatinine, Ser 0.84  0.50 - 1.10 mg/dL   Calcium 9.3  8.4 - 10.5 mg/dL   GFR calc non Af Amer 71 (*) >90 mL/min   GFR calc Af Amer 83 (*) >90 mL/min   Comment: (NOTE)     The eGFR has been calculated using the CKD EPI equation.     This calculation has not been validated in all clinical situations.     eGFR's persistently <90 mL/min signify possible Chronic Kidney     Disease.   Anion gap 15  5 - 15  I-STAT TROPOININ, ED     Status: None   Collection Time  03/30/14  9:57 PM      Result Value Ref Range   Troponin i, poc 0.00  0.00 - 0.08 ng/mL   Comment 3            Comment: Due to the release kinetics of  cTnI,     a negative result within the first hours     of the onset of symptoms does not rule out     myocardial infarction with certainty.     If myocardial infarction is still suspected,     repeat the test at appropriate intervals.   Dg Chest Port 1 View  03/30/2014   CLINICAL DATA:  Chest pain and left arm numbness.  EXAM: PORTABLE CHEST - 1 VIEW  COMPARISON:  None.  FINDINGS: Cardiac silhouette is unremarkable for this low inspiratory portable examination with crowded vasculature markings. Mildly calcified aortic knob. The lungs are clear without pleural effusions or focal consolidations. Trachea projects midline and there is no pneumothorax. Included soft tissue planes and osseous structures are non-suspicious. Mild dextroscoliosis.  IMPRESSION: No acute cardiopulmonary process for this low inspiratory portable examination.   Electronically Signed   By: Elon Alas   On: 03/30/2014 22:33    Review of Systems  Constitutional: Negative for fever, chills and weight loss.  Eyes: Negative for double vision.  Respiratory: Positive for shortness of breath. Negative for cough, hemoptysis and sputum production.   Cardiovascular: Positive for chest pain and claudication. Negative for palpitations, orthopnea, leg swelling and PND.  Gastrointestinal: Positive for nausea. Negative for vomiting, abdominal pain and diarrhea.  Genitourinary: Negative for dysuria.  Neurological: Negative for dizziness and headaches.    Blood pressure 98/52, pulse 70, temperature 97.9 F (36.6 C), temperature source Oral, resp. rate 18, height 5' 4.5" (1.638 m), weight 80.922 kg (178 lb 6.4 oz), SpO2 98.00%. Physical Exam  Constitutional: She is oriented to person, place, and time.  HENT:  Head: Atraumatic.  Eyes: Conjunctivae are normal. Pupils are equal, round, and reactive to light. Left eye exhibits no discharge.  Neck: Normal range of motion. Neck supple. No tracheal deviation present. No thyromegaly  present.  Cardiovascular: Normal rate and regular rhythm.   Murmur (Soft systolic murmur noted no S3 gallop) heard. Respiratory: Effort normal and breath sounds normal. No respiratory distress. She has no wheezes. She has no rales.  GI: Soft. Bowel sounds are normal. She exhibits no distension. There is no tenderness. There is no rebound.  Musculoskeletal: She exhibits no edema and no tenderness.  Neurological: She is alert and oriented to person, place, and time.     Assessment/Plan  Recurrent chest pain worrisome for angina rule out MI rule out progression of CAD  Coronary artery disease history of ventral posterior wall myocardial infarction status post PCI to RCA in the past History of cardiac arrest in the past Hypertension Cholesteremia COPD History of tobacco abuse Peripheral vascular disease Positive family history of coronary artery disease Degenerative joint disease GERD Plan As per orders Jamie-Lee Galdamez N 03/31/2014, 7:39 AM

## 2014-03-31 NOTE — ED Notes (Signed)
Transporting patient to new room assignment. 

## 2014-03-31 NOTE — ED Notes (Signed)
Dr. Delo at bedside. 

## 2014-04-01 ENCOUNTER — Encounter (HOSPITAL_COMMUNITY): Payer: Medicare Other

## 2014-04-01 ENCOUNTER — Inpatient Hospital Stay (HOSPITAL_COMMUNITY): Payer: Medicare Other

## 2014-04-01 ENCOUNTER — Encounter (HOSPITAL_COMMUNITY)
Admission: EM | Disposition: A | Discharge status: Home / Self Care | Payer: Medicare Other | Source: Home / Self Care | Attending: Cardiology

## 2014-04-01 HISTORY — PX: LEFT HEART CATHETERIZATION WITH CORONARY ANGIOGRAM: SHX5451

## 2014-04-01 LAB — CBC
HEMATOCRIT: 41.8 % (ref 36.0–46.0)
HEMOGLOBIN: 13.5 g/dL (ref 12.0–15.0)
MCH: 31.3 pg (ref 26.0–34.0)
MCHC: 32.3 g/dL (ref 30.0–36.0)
MCV: 96.8 fL (ref 78.0–100.0)
Platelets: 210 10*3/uL (ref 150–400)
RBC: 4.32 MIL/uL (ref 3.87–5.11)
RDW: 13.6 % (ref 11.5–15.5)
WBC: 7.5 10*3/uL (ref 4.0–10.5)

## 2014-04-01 LAB — LIPID PANEL
CHOLESTEROL: 189 mg/dL (ref 0–200)
HDL: 44 mg/dL (ref >39)
LDL Cholesterol: 106 mg/dL — ABNORMAL HIGH (ref 0–99)
TRIGLYCERIDES: 194 mg/dL — AB (ref <150)
Total CHOL/HDL Ratio: 4.3 RATIO
VLDL: 39 mg/dL (ref 0–40)

## 2014-04-01 LAB — BASIC METABOLIC PANEL
ANION GAP: 12 (ref 5–15)
BUN: 11 mg/dL (ref 6–23)
CHLORIDE: 107 meq/L (ref 96–112)
CO2: 22 mEq/L (ref 19–32)
CREATININE: 0.52 mg/dL (ref 0.50–1.10)
Calcium: 9 mg/dL (ref 8.4–10.5)
GFR calc non Af Amer: >90 mL/min (ref >90)
Glucose, Bld: 98 mg/dL (ref 70–99)
Potassium: 3.9 mEq/L (ref 3.7–5.3)
SODIUM: 141 meq/L (ref 137–147)

## 2014-04-01 LAB — POCT ACTIVATED CLOTTING TIME
Activated Clotting Time: 467 seconds
Activated Clotting Time: 146 seconds

## 2014-04-01 LAB — MRSA PCR SCREENING: MRSA by PCR: NEGATIVE

## 2014-04-01 LAB — HEPARIN LEVEL (UNFRACTIONATED): Heparin Unfractionated: 0.48 IU/mL (ref 0.30–0.70)

## 2014-04-01 LAB — TROPONIN I: Troponin I: <0.3 ng/mL (ref <0.30)

## 2014-04-01 SURGERY — LEFT HEART CATHETERIZATION WITH CORONARY ANGIOGRAM
Anesthesia: LOCAL

## 2014-04-01 MED ORDER — TECHNETIUM TC 99M SESTAMIBI - CARDIOLITE: 30.0000 | Freq: Once | INTRAVENOUS | Status: AC | PRN

## 2014-04-01 MED ORDER — MIDAZOLAM HCL 2 MG/2ML IJ SOLN
INTRAMUSCULAR | Status: AC
  Filled 2014-04-01: qty 2

## 2014-04-01 MED ORDER — ATROPINE SULFATE 0.1 MG/ML IJ SOLN
INTRAMUSCULAR | Status: DC
Start: 2014-04-01 — End: 2014-04-01
  Filled 2014-04-01: qty 10

## 2014-04-01 MED ORDER — HEPARIN (PORCINE) IN NACL 2-0.9 UNIT/ML-% IJ SOLN
INTRAMUSCULAR | Status: AC
  Filled 2014-04-01: qty 1500

## 2014-04-01 MED ORDER — TICAGRELOR 90 MG PO TABS
ORAL_TABLET | ORAL | Status: AC
  Filled 2014-04-01: qty 2

## 2014-04-01 MED ORDER — REGADENOSON 0.4 MG/5ML IV SOLN
0.4000 mg | Freq: Once | INTRAVENOUS | Status: AC
  Administered 2014-04-01: 0.4 mg via INTRAVENOUS

## 2014-04-01 MED ORDER — LIDOCAINE HCL (PF) 1 % IJ SOLN
INTRAMUSCULAR | Status: AC
  Filled 2014-04-01: qty 30

## 2014-04-01 MED ORDER — REGADENOSON 0.4 MG/5ML IV SOLN
INTRAVENOUS | Status: AC
  Administered 2014-04-01: 0.4 mg via INTRAVENOUS
  Filled 2014-04-01: qty 5

## 2014-04-01 MED ORDER — BIVALIRUDIN 250 MG IV SOLR
INTRAVENOUS | Status: AC
  Filled 2014-04-01: qty 250

## 2014-04-01 MED ORDER — ACETAMINOPHEN 325 MG PO TABS
650.0000 mg | ORAL_TABLET | ORAL | Status: DC | PRN
Start: 2014-04-01 — End: 2014-04-02
  Administered 2014-04-01 – 2014-04-02 (×2): 650 mg via ORAL
  Filled 2014-04-01 (×2): qty 2

## 2014-04-01 MED ORDER — SODIUM CHLORIDE 0.9 % IV SOLN
0.2500 mg/kg/h | INTRAVENOUS | Status: DC
  Filled 2014-04-01: qty 250

## 2014-04-01 MED ORDER — NITROGLYCERIN 1 MG/10 ML FOR IR/CATH LAB
INTRA_ARTERIAL | Status: AC
  Filled 2014-04-01: qty 10

## 2014-04-01 MED ORDER — MORPHINE SULFATE 10 MG/ML IJ SOLN
INTRAMUSCULAR | Status: AC
  Filled 2014-04-01: qty 1

## 2014-04-01 MED ORDER — ASPIRIN 81 MG PO CHEW
CHEWABLE_TABLET | ORAL | Status: AC
  Filled 2014-04-01: qty 4

## 2014-04-01 MED ORDER — SODIUM CHLORIDE 0.9 % IV SOLN
INTRAVENOUS | Status: AC
  Administered 2014-04-01: 150 mL via INTRAVENOUS

## 2014-04-01 MED ORDER — ONDANSETRON HCL 4 MG/2ML IJ SOLN: 4.0000 mg | Freq: Four times a day (QID) | INTRAMUSCULAR | Status: DC | PRN

## 2014-04-01 MED ORDER — ESOMEPRAZOLE MAGNESIUM 40 MG PO CPDR
40.0000 mg | DELAYED_RELEASE_CAPSULE | Freq: Two times a day (BID) | ORAL | Status: DC
  Administered 2014-04-01 – 2014-04-02 (×2): 40 mg via ORAL
  Filled 2014-04-01 (×5): qty 1

## 2014-04-01 MED ORDER — ASPIRIN 81 MG PO CHEW
324.0000 mg | CHEWABLE_TABLET | Freq: Once | ORAL | Status: AC
  Administered 2014-04-01: 324 mg via ORAL

## 2014-04-01 MED ORDER — FENTANYL CITRATE 0.05 MG/ML IJ SOLN
INTRAMUSCULAR | Status: AC
  Filled 2014-04-01: qty 2

## 2014-04-01 MED ORDER — NITROGLYCERIN IN D5W 200-5 MCG/ML-% IV SOLN: 5.0000 ug/min | INTRAVENOUS | Status: DC

## 2014-04-01 MED ORDER — TECHNETIUM TC 99M SESTAMIBI - CARDIOLITE
10.0000 | Freq: Once | INTRAVENOUS | Status: AC | PRN
  Administered 2014-04-01: 10 via INTRAVENOUS

## 2014-04-01 MED ORDER — HEPARIN SODIUM (PORCINE) 1000 UNIT/ML IJ SOLN
INTRAMUSCULAR | Status: AC
  Filled 2014-04-01: qty 1

## 2014-04-01 NOTE — Cardiovascular Report (Signed)
NAMEJERILEE, SPACE                ACCOUNT NO.:  [id number]  MEDICAL RECORD NO.:  [id number]  LOCATION:  [id number]                        FACILITY:  [facility]  PHYSICIAN:  Peace Jost N. Terrence Dupont, M.D. DATE OF BIRTH:  [birth year]  DATE OF PROCEDURE:  04/01/2014 DATE OF DISCHARGE:                           CARDIAC CATHETERIZATION   PROCEDURES PERFORMED: 1. Left cardiac cath with selective left and right coronary     angiography, LV angiography via left groin using Judkins technique. 2. Attempted PCI to chronically occluded RCA.  INDICATION FOR THE PROCEDURE:  Ms. [name] is a 63 year old female with past medical history significant for coronary artery disease, history of inferoposterior wall myocardial infarction in September of 2013, status post primary PCI to 100% occluded RCA, history of VFib arrest, hypertension, hypercholesteremia, COPD, history of tobacco abuse, positive family history of coronary artery disease, peripheral vascular disease, recently noted to have critical occlusion of the right common ostial iliac artery.  She came to the ER complaining of recurrent retrosternal chest pain, described as burning, occasionally radiating to the left arm lasting few minutes to few hours associated with mild shortness of breath, diaphoresis, and nausea.  Patient states she gets occasional numbness also in her lower teeth during chest pain.  The patient took 3 sublingual nitro yesterday without much relief, so decided to come to ED.  The patient states chest pain is going on for last few months off and on.  Refused stress test in the past, but as the pain got worse yesterday, so decided to come to ED.  The patient also gives history of claudication pain.  Recently had peripheral angiogram and was advice for fem-fem bypass.  The patient was admitted to telemetry unit.  MI was ruled out by serial enzymes and EKG.  During nuclear stress test, the patient developed severe burning typical anginal chest  pain.  EKG showed new minor ST elevation in inferior leads, with Q-waves and also reciprocal horizontal ST depression in lead I and aVL suggestive of acute inferior injury.  Discussed with the patient regarding emergency left cath, possible PTCA stenting, its risks and benefits, i.e., death, MI, stroke, need for emergency CABG, local vascular complications, etc. and consented for PCI.  DESCRIPTION OF PROCEDURE:  After obtaining the informed consent, patient was brought to the cath lab and was placed on fluoroscopy table.  The left groin was prepped and draped in usual fashion.  1% Xylocaine was used for local anesthesia in the left groin.  With the help of thin wall needle, 6-French arterial sheath was placed.  The sheath was aspirated and flushed.  Next, 6-French left Judkins catheter was advanced over the wire under fluoroscopic guidance up to the ascending aorta.  Wire was pulled out.  The catheter was aspirated and connected to the Manifold. Catheter was further advanced and engaged into left coronary ostium. Multiple views of the left system were taken.  Next, the catheter was disengaged and was pulled out over the wire and was replaced with 6- [place] right Judkins catheter, which was advanced over the wire under fluoroscopic guidance up to the ascending aorta.  Wire was pulled out. The catheter was aspirated and connected to the Manifold.  Catheter was further advanced and engaged into right coronary ostium.  Multiple views of the right system were taken.  Next, catheter was disengaged and was pulled out over the wire and was replaced with 6-French pigtail catheter, which was advanced over the wire under fluoroscopic guidance up to the ascending aorta.  Wire was pulled out.  The catheter was aspirated and connected to the Manifold.  Catheter was further advanced and across the aortic valve into the LV.  LV pressures were recorded. Next, LV graft was done in 30-degree RAO position.   Post-angiographic pressures were recorded from LV and then pullback pressures were recorded from the aorta.  There was no gradient across aortic valve. Next, pigtail catheter was pulled out over the wire.  Sheaths were aspirated and flushed.  FINDINGS:  LV showed inferobasal wall hypokinesia, EF of approximately 50%.  Left main has 15-20% ostial and distal stenosis.  LAD has 30-35% proximal and mid stenosis.  Diagonal 1 is very small.  Left circumflex has 30-40% proximal stenosis.  OM1 is small which has mild disease.  OM2 is very small.  RCA was 100% occluded beyond the ostium with TIMI 0 flow filling by collaterals from the left system, distally.  INTERVENTIONAL PROCEDURE:  Attempted to do PCI to RCA using multiple wires i.e., ChoICE PT Moderate support run-through wire, Fielder XT wire Prowater wire, Miracle Brothers 6 and 12 g without success.  The wire could not be advanced beyond the ostium as the guide catheter will back out.  The guiding catheter was also switched JR4 to LIMA guide and then Amplats guided without good backup support.  The patient's pain resolved during the procedure.  The patient did not have any chest pain at the end of the procedure.  This vessel appears to be chronically occluded. The patient received weight based Angiomax, during the procedure.  The patient tolerated the procedure well.  There were no complications. Plan is to maximize antianginal medications and treat medically.  The patient has been counseled extensively in the past regarding diet, lifestyle modification, and smoking cessation.     Allegra Lai. Terrence Dupont, M.D.     MNH/MEDQ  D:  04/01/2014  T:  04/01/2014  Job:  [id number]

## 2014-04-01 NOTE — Progress Notes (Signed)
Subjective:  Patient developed severe persistent chest pain associated with mild ST elevation in inferior leads with old Q waves and resiprocal horizontal ST depression in lead 1 aVL  after receiving lexiscan . Suggestive of acute inferior wall ischemia/injury.  Objective:  Vital Signs in the last 24 hours: Temp:  [97.7 F (36.5 C)-98.2 F (36.8 C)] 98.1 F (36.7 C) (07/28 0431) Pulse Rate:  [62-127] 99 (07/28 0949) Resp:  [18] 18 (07/27 1956) BP: (97-164)/(54-83) 147/76 mmHg (07/28 0949) SpO2:  [81 %-97 %] 81 % (07/28 0933)  Intake/Output from previous day: 07/27 0701 - 07/28 0700 In: 360 [P.O.:360] Out: 105 [Urine:103; Stool:2] Intake/Output from this shift:    Physical Exam: Neck: no adenopathy, no carotid bruit, no JVD and supple, symmetrical, trachea midline Lungs: clear to auscultation bilaterally Heart: regular rate and rhythm, S1, S2 normal, no murmur, click, rub or gallop Abdomen: soft, non-tender; bowel sounds normal; no masses,  no organomegaly Extremities: extremities normal, atraumatic, no cyanosis or edema  Lab Results:  Recent Labs  03/31/14 0945 04/01/14 0500  WBC 9.6 7.5  HGB 14.1 13.5  PLT 235 210    Recent Labs  03/31/14 0945 04/01/14 0500  NA 141 141  K 3.8 3.9  CL 106 107  CO2 23 22  GLUCOSE 119* 98  BUN 14 11  CREATININE 0.56 0.52    Recent Labs  03/31/14 1344 03/31/14 2027  TROPONINI <0.30 <0.30   Hepatic Function Panel  Recent Labs  03/31/14 0945  PROT 6.3  ALBUMIN 3.6  AST 14  ALT 14  ALKPHOS 114  BILITOT 0.3    Recent Labs  04/01/14 0500  CHOL 189   No results found for this basename: PROTIME,  in the last 72 hours  Imaging: Imaging results have been reviewed and Dg Chest Port 1 View  03/30/2014   CLINICAL DATA:  Chest pain and left arm numbness.  EXAM: PORTABLE CHEST - 1 VIEW  COMPARISON:  None.  FINDINGS: Cardiac silhouette is unremarkable for this low inspiratory portable examination with crowded vasculature  markings. Mildly calcified aortic knob. The lungs are clear without pleural effusions or focal consolidations. Trachea projects midline and there is no pneumothorax. Included soft tissue planes and osseous structures are non-suspicious. Mild dextroscoliosis.  IMPRESSION: No acute cardiopulmonary process for this low inspiratory portable examination.   Electronically Signed   By: Elon Alas   On: 03/30/2014 22:33    Cardiac Studies:  Assessment/Plan:  Recurrent chest pain MI ruled out positive lexiscan stress test Coronary artery disease history of ventral posterior wall myocardial infarction status post PCI to RCA in the past  History of cardiac arrest in the past  Hypertension  Cholesteremia  COPD  History of tobacco abuse  Peripheral vascular disease with critical right common iliac ostial stenosis Positive family history of coronary artery disease  Degenerative joint disease  GERD Plan Discussed with patient regarding emergent left cath possible PTCA stenting this risk and benefits i.e. death MI stroke need for emergency CABG local vascular concretions etc. and consented for PCI.  LOS: 2 days    [name] N 04/01/2014, 12:16 PM

## 2014-04-01 NOTE — Progress Notes (Signed)
Pt taken to cath lab immediately after stress test - Mr. Toth requested that her daughter, Montine Circle, be notified.  Spoke with Montine Circle and informed her of above and that she can check with the floor nurse after cath is completed. She is agreeable with this.

## 2014-04-01 NOTE — Progress Notes (Signed)
Left femoral sheath removed per protocol . VSS, site level 0.Pt .tolerated procedure well.   Etta Quill

## 2014-04-02 LAB — CBC
HCT: 36.8 % (ref 36.0–46.0)
Hemoglobin: 12.2 g/dL (ref 12.0–15.0)
MCH: 31.9 pg (ref 26.0–34.0)
MCHC: 33.2 g/dL (ref 30.0–36.0)
MCV: 96.3 fL (ref 78.0–100.0)
Platelets: 216 10*3/uL (ref 150–400)
RBC: 3.82 MIL/uL — ABNORMAL LOW (ref 3.87–5.11)
RDW: 13.6 % (ref 11.5–15.5)
WBC: 8.9 10*3/uL (ref 4.0–10.5)

## 2014-04-02 LAB — TROPONIN I
Troponin I: <0.3 ng/mL (ref <0.30)
Troponin I: <0.3 ng/mL (ref <0.30)

## 2014-04-02 MED ORDER — NITROGLYCERIN 0.2 MG/HR TD PT24: 0.2000 mg | MEDICATED_PATCH | Freq: Every day | TRANSDERMAL | Status: AC

## 2014-04-02 MED FILL — Sodium Chloride IV Soln 0.9%: INTRAVENOUS | Qty: 50 | Status: AC

## 2014-04-02 NOTE — Discharge Summary (Signed)
Kristina, Ward                ACCOUNT NO.:  [id number]  MEDICAL RECORD NO.:  [id number]  LOCATION:  [id number]                        FACILITY:  [facility]  PHYSICIAN:  Renelda Kilian N. Terrence Dupont, M.D. DATE OF BIRTH:  08-May-1951  DATE OF ADMISSION:  03/30/2014 DATE OF DISCHARGE:  04/02/2014                              DISCHARGE SUMMARY   ADMITTING DIAGNOSES: 1. Recurrent chest pain, worrisome for angina, rule out myocardial     infarction, rule out progression of coronary artery disease. 2. Coronary artery disease, history of inferoposterior wall myocardial     infarction, status post percutaneous coronary intervention to right     coronary artery in the past, history of ventricular fibrillation     arrest in the past. 3. Hypertension. 4. Hypercholesteremia. 5. Chronic obstructive pulmonary disease. 6. History of tobacco abuse. 7. Peripheral vascular disease. 8. Positive family history of coronary artery disease. 9. Degenerative joint disease. 10.Gastroesophageal reflux disease.  FINAL DIAGNOSES: 1. Unstable angina, positive nuclear stress test, status post cardiac     catheterization and attempted percutaneous coronary intervention to     chronically occluded right coronary artery. 2. Coronary artery disease, history of inferoposterior wall myocardial     infarction in September of 2013, status post percutaneous coronary     intervention to right coronary artery with multiple stents, history     of cardiac arrest in the past. 3. Hypertension. 4. Hypercholesteremia. 5. Chronic obstructive pulmonary disease. 6. History of tobacco abuse. 7. Peripheral vascular disease with critical stenosis of right ostial     common iliac artery. 8. Positive family history of coronary artery disease. 9. Gastroesophageal reflux disease. 10.Degenerative joint disease.  DISCHARGE HOME MEDICATIONS: 1. Nitrostat 0.4 mg sublingual use as directed. 2. Nitroglycerin 0.2 mg/hour patch, one patch daily. 3.  Tylenol 500 mg four times daily as before. 4. Albuterol inhaler two puffs two times daily as before. 5. Xanax 0.5 mg twice daily as before. 6. Amlodipine 10 mg one tablet daily. 7. Aspirin 81 mg one tablet daily. 8. Atorvastatin 40 mg daily. 9. Symbicort two puffs twice daily as before. 10.Nexium 40 mg daily. 11.Lisinopril 10 mg daily. 12.Metoprolol tartrate 25 mg twice daily. 13.Ranitidine 150 mg daily. 14.Systane eye drops as before.  DIET:  Low salt, low cholesterol diet.  ACTIVITY:  Increase activity slowly as tolerated.  DISCHARGE INSTRUCTIONS:  Postcardiac cath instructions have been given. Follow up with me in 1 week.  CONDITION AT DISCHARGE:  Stable.  BRIEF HISTORY AND HOSPITAL COURSE:  [name] is 63 year old female with past medical history significant for coronary artery disease; history of inferoposterior wall myocardial infarction in September of 2013, status post primary PCI to 100% occluded RCA; history of VFib arrest; hypertension; hypercholesteremia; COPD; history of tobacco abuse; positive family history of coronary artery disease; peripheral vascular disease.  She came to the ER complaining of recurrent retrosternal chest pain, described as burning, occasionally radiating to the left arm, lasting few minutes to few hours, associated with mild shortness of breath, diaphoresis and nausea.  The patient also states she gets occasional numbness in the lower teeth during the chest pain.  The patient took three sublingual nitro yesterday without much relief, so  decided to come to ED.  The patient states this chest pain is going on for last few months, off and on, refused stress test in the past, but as the pain got worse yesterday, so decided to come to ED.  The patient also gives history of claudication, pain, recently had peripheral angiogram and was advised for fem-fem bypass.  PAST MEDICAL HISTORY:  As above.  PHYSICAL EXAMINATION:  GENERAL:  She was alert,  awake, and oriented x3 in no acute distress. VITAL SIGNS:  Blood pressure was 98/52, pulse was 78, temperature was 97.9. HEENT:  Conjunctiva was pink. NECK:  Supple.  No JVD.  No bruit. LUNGS:  Clear to auscultation without rhonchi or rales. CARDIOVASCULAR:  S1, S2 was normal.  There was soft systolic murmur.  No S3, gallop. ABDOMEN:  Soft.  Bowel sounds were present.  Nontender. EXTREMITIES:  There was no clubbing, cyanosis, or edema.  LABORATORY DATA:  Sodium was 141, potassium 3.8, BUN 16, creatinine 0.84.  Troponin-I were negative.  Cholesterol was 189, triglycerides 194, HDL 44, LDL 106.  Hemoglobin was 14.8, hematocrit 43.4, white count of 11.5.  EKG showed normal sinus rhythm and small Q-waves in inferior leads with nonspecific T-wave changes and also septal Q-waves.  BRIEF HOSPITAL COURSE:  The patient was admitted to telemetry unit.  MI was ruled out by serial enzymes and EKG.  The patient subsequently underwent Lexiscan Myoview.  During the stress test, the patient developed severe chest pain with ST elevation in inferior leads with reciprocal ST depression in lead 1 and AVM.  The patient subsequently was taken to the Cardiac Cath Lab and was noted to have occluded RCA, which was filling from the collaterals from the left system.  Attempted to pass the wire into this chronically occluded without success as there was no guiding support despite using multiple guides and also multiple wires.  The patient tolerated the procedure well.  There were no complications.  Postprocedure, three sets of cardiac enzymes are normal.  Her EKG is back to baseline with no acute ischemic changes.  Her groin is stable with no evidence of hematoma or bruit.  The patient will be discharged home on above medications and will be followed up in my office in 1 week.     [name]. Terrence Dupont, M.D.     MNH/MEDQ  D:  04/02/2014  T:  04/02/2014  Job:  [id number]

## 2014-04-02 NOTE — Discharge Summary (Signed)
  Discharge summary dictated on 04/02/2014 dictation number is [phone]

## 2014-04-02 NOTE — Discharge Instructions (Signed)
Angina Pectoris  Angina pectoris, often just called angina, is extreme discomfort in your chest, neck, or arm caused by a lack of blood in the middle and thickest layer of your heart wall (myocardium). It may feel like tightness or heavy pressure. It may feel like a crushing or squeezing pain. Some people say it feels like gas or indigestion. It may go down your shoulders, back, and arms. Some people may have symptoms other than pain. These symptoms include fatigue, shortness of breath, cold sweats, or nausea. There are four different types of angina:  · Stable angina--Stable angina usually occurs in episodes of predictable frequency and duration. It usually is brought on by physical activity, emotional stress, or excitement. These are all times when the myocardium needs more oxygen. Stable angina usually lasts a few minutes and often is relieved by taking a medicine that can be taken under your tongue (sublingually). The medicine is called nitroglycerin. Stable angina is caused by a buildup of plaque inside the arteries, which restricts blood flow to the heart muscle (atherosclerosis).  · Unstable angina--Unstable angina can occur even when your body experiences little or no physical exertion. It can occur during sleep. It can also occur at rest. It can suddenly increase in severity or frequency. It might not be relieved by sublingual nitroglycerin. It can last up to 30 minutes. The most common cause of unstable angina is a blood clot that has developed on the top of plaque buildup inside a coronary artery. It can lead to a heart attack if the blood clot completely blocks the artery.  · Microvascular angina--This type of angina is caused by a disorder of tiny blood vessels called arterioles. Microvascular angina is more common in women. The pain may be more severe and last longer than other types of angina pectoris.  · Prinzmetal or variant angina--This type of angina pectoris usually occurs when your body  experiences little or no physical exertion. It especially occurs in the early morning hours. It is caused by a spasm of your coronary artery.  HOME CARE INSTRUCTIONS   · Only take over-the-counter and prescription medicines as directed by your health care provider.  · Stay active or increase your exercise as directed by your health care provider.  · Limit strenuous activity as directed by your health care provider.  · Limit heavy lifting as directed by your health care provider.  · Maintain a healthy weight.  · Learn about and eat heart-healthy foods.  · Do not use any tobacco products including cigarettes, chewing tobacco or electronic cigarettes.  SEEK IMMEDIATE MEDICAL CARE IF:   You experience the following symptoms:  · Chest, neck, deep shoulder, or arm pain or discomfort that lasts more than a few minutes.  · Chest, neck, deep shoulder, or arm pain or discomfort that goes away and comes back, repeatedly.  · Heavy sweating with discomfort, without a noticeable cause.  · Shortness of breath or difficulty breathing.  · Angina that does not get better after a few minutes of rest or after taking sublingual nitroglycerin.  These can all be symptoms of a heart attack, which is a medical emergency! Get medical help at once. Call your local emergency service (911 in [place] immediately. Do not  drive yourself to the hospital and do not  wait to for your symptoms to go away.  MAKE SURE YOU:  · Understand these instructions.  · Will watch your condition.  · Will get help right away if you are not   doing well or get worse.  Document Released: 08/22/2005 Document Revised: 08/27/2013 Document Reviewed: 12/24/2013  ExitCare® Patient Information ©2015 [employer]. This information is not intended to replace advice given to you by your health care provider. Make sure you discuss any questions you have with your health care provider.

## 2014-04-02 NOTE — Care Management Note (Signed)
    Page 1 of 1   04/02/2014     4:24:42 PM CARE MANAGEMENT NOTE 04/02/2014  Patient:  [name], [name]   Account Number:  [id number]  Date Initiated:  04/01/2014  Documentation initiated by:  Elissa Hefty  Subjective/Objective Assessment:   adm w ch pain     Action/Plan:   lives w fam, pcp dr m badger   Anticipated DC Date:  04/04/2014   Anticipated DC Plan:  [place]  CM consult      Choice offered to / List presented to:             Status of service:  Completed, signed off Medicare Important Message given?  YES (If response is "NO", the following Medicare IM given date fields will be blank) Date Medicare IM given:  04/02/2014 Medicare IM given by:  [facility] Date Additional Medicare IM given:   Additional Medicare IM given by:    Discharge Disposition:    Per UR Regulation:  Reviewed for med. 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  Has a cane that she uses if needed. Plan for dc today.   IM given.

## 2014-04-08 NOTE — ED Provider Notes (Signed)
I saw and evaluated the patient, reviewed the resident's note and I agree with the findings and plan. Patient is a 63 year old female with history of CAD with MI in 2013. She presents with complaints of worsening chest pain for the past 3 weeks. She had an episode today where she became diaphoretic and short of breath. She presents for evaluation of this.  On exam, vitals are stable and the patient is afebrile. Head is atraumatic, normocephalic. Neck is supple. Heart is regular rate and rhythm and lungs are clear. Abdomen is soft, nontender, nondistended. Extremities are without edema.  Workup reveals a negative troponin and unchanged EKG. Due to her history and nature of her symptoms, Dr. Terrence Dupont has been consult and will admit the patient.   EKG Interpretation   Date/Time:  Sunday March 30 2014 21:30:58 EDT Ventricular Rate:  111 PR Interval:  162 QRS Duration: 84 QT Interval:  336 QTC Calculation: 456 R Axis:   18 Text Interpretation:  Sinus tachycardia Inferior infarct , age  undetermined Anterolateral infarct , age undetermined Abnormal ECG ED  PHYSICIAN INTERPRETATION AVAILABLE IN [employer] Confirmed by TEST,  Record ([id number]) on 04/01/2014 7:17:51 AM        Veryl Speak, MD 04/08/14 [phone]

## 2014-04-18 ENCOUNTER — Ambulatory Visit: Payer: Medicare Other | Admitting: Vascular Surgery

## 2014-04-24 ENCOUNTER — Encounter: Payer: Self-pay | Admitting: Vascular Surgery

## 2014-04-25 ENCOUNTER — Ambulatory Visit (INDEPENDENT_AMBULATORY_CARE_PROVIDER_SITE_OTHER): Payer: Medicare Other | Admitting: Vascular Surgery

## 2014-04-25 ENCOUNTER — Encounter: Payer: Self-pay | Admitting: Vascular Surgery

## 2014-04-25 VITALS — BP 107/73 | HR 70 | Resp 16 | Ht 64.5 in | Wt 179.0 lb

## 2014-04-25 DIAGNOSIS — I70219 Atherosclerosis of native arteries of extremities with intermittent claudication, unspecified extremity: Secondary | ICD-10-CM

## 2014-04-25 NOTE — Progress Notes (Signed)
    Established Intermittent Claudication  History of Present Illness  [name] is a 63 y.o. (Oct 22, 1950) female who presents with chief complaint: Left anterior calf pain.  The patient's symptoms have not progressed.  The patient's symptoms are: aching in left anterior calf with walk.  Pt has history of fracturing bone in left lower leg.  The patient has non-intermittent claudication sx in both legs.  On recent diagnosis aortogram and bilateral leg runoff: R CIA >90% stenosis, L CIA >30% stenosis, narrowed distal aorta.  The patient's treatment regimen currently included: maximal medical management.  Pt recently was admitted with unstable angina.  See Dr. Zenia Resides note for details.  The patient's PMH, PSH, SH, FamHx, Med, and Allergies are unchanged from 02/06/14.  On ROS today: no intermittent claudication , no rest pain  Physical Examination  Filed Vitals:   04/25/14 1152  BP: 107/73  Pulse: 70  Resp: 16  Height: 5' 4.5" (1.638 m)  Weight: 179 lb (81.194 kg)  SpO2: 99%   Body mass index is 30.26 kg/(m^2).  General: A&O x 3, WD, heavy odor of tobacco  Pulmonary: Sym exp, good air movt, CTAB, no rales, rhonchi, & wheezing  Cardiac: RRR, Nl S1, S2, no Murmurs, rubs or gallops  Vascular: Vessel Right Left  Radial Palpable Palpable  Brachial Palpable Palpable  Carotid Palpable, without bruit Palpable, without bruit  Aorta Not palpable N/A  Femoral Palpable Palpable  Popliteal Not palpable Not palpable  PT Not Palpable Palpable  DP Not Palpable Palpable   Gastrointestinal: soft, NTND, -G/R, - HSM, - masses, - CVAT B  Musculoskeletal: M/S 5/5 throughout , Extremities without ischemic changes except  L>R dependent rubor  Neurologic: Pain and light touch intact in extremities , Motor exam as listed above   Medical Decision Making  ARASELY AKKERMAN is a 63 y.o. female who presents with:  Bilateral aortoiliac disease with high grade R CIA stenosis  Based on the  patient's vascular studies and examination, I have offered the patient: L to R fem-fem BPG.  Pt's distal aorta is calcified as are both common iliac arteries.  A kissing technique would be needed, unfortunately, this might result in rupture of distal aorta.  I discussed in depth with the patient the nature of atherosclerosis, and emphasized the importance of maximal medical management including strict control of blood pressure, blood glucose, and lipid levels, antiplatelet agents, obtaining regular exercise, and cessation of smoking.    The patient is aware that without maximal medical management the underlying atherosclerotic disease process will progress, limiting the benefit of any interventions. The patient is currently on a statin: Lipitor.   The patient is currently on an anti-platelet: ASA.   Thank you for allowing [place] to participate in this patient's care.  Kristina Barthel, MD [facility] Office: [phone] Pager: [phone]  04/25/2014, 1:09 PM

## 2014-06-10 IMAGING — CR DG CHEST 1V PORT
1 series · 1 of 1 positions shown · non-contrast
Comparison: 08/29/2012

CLINICAL DATA: Chest pain.

PORTABLE CHEST - 1 VIEW

[AP]
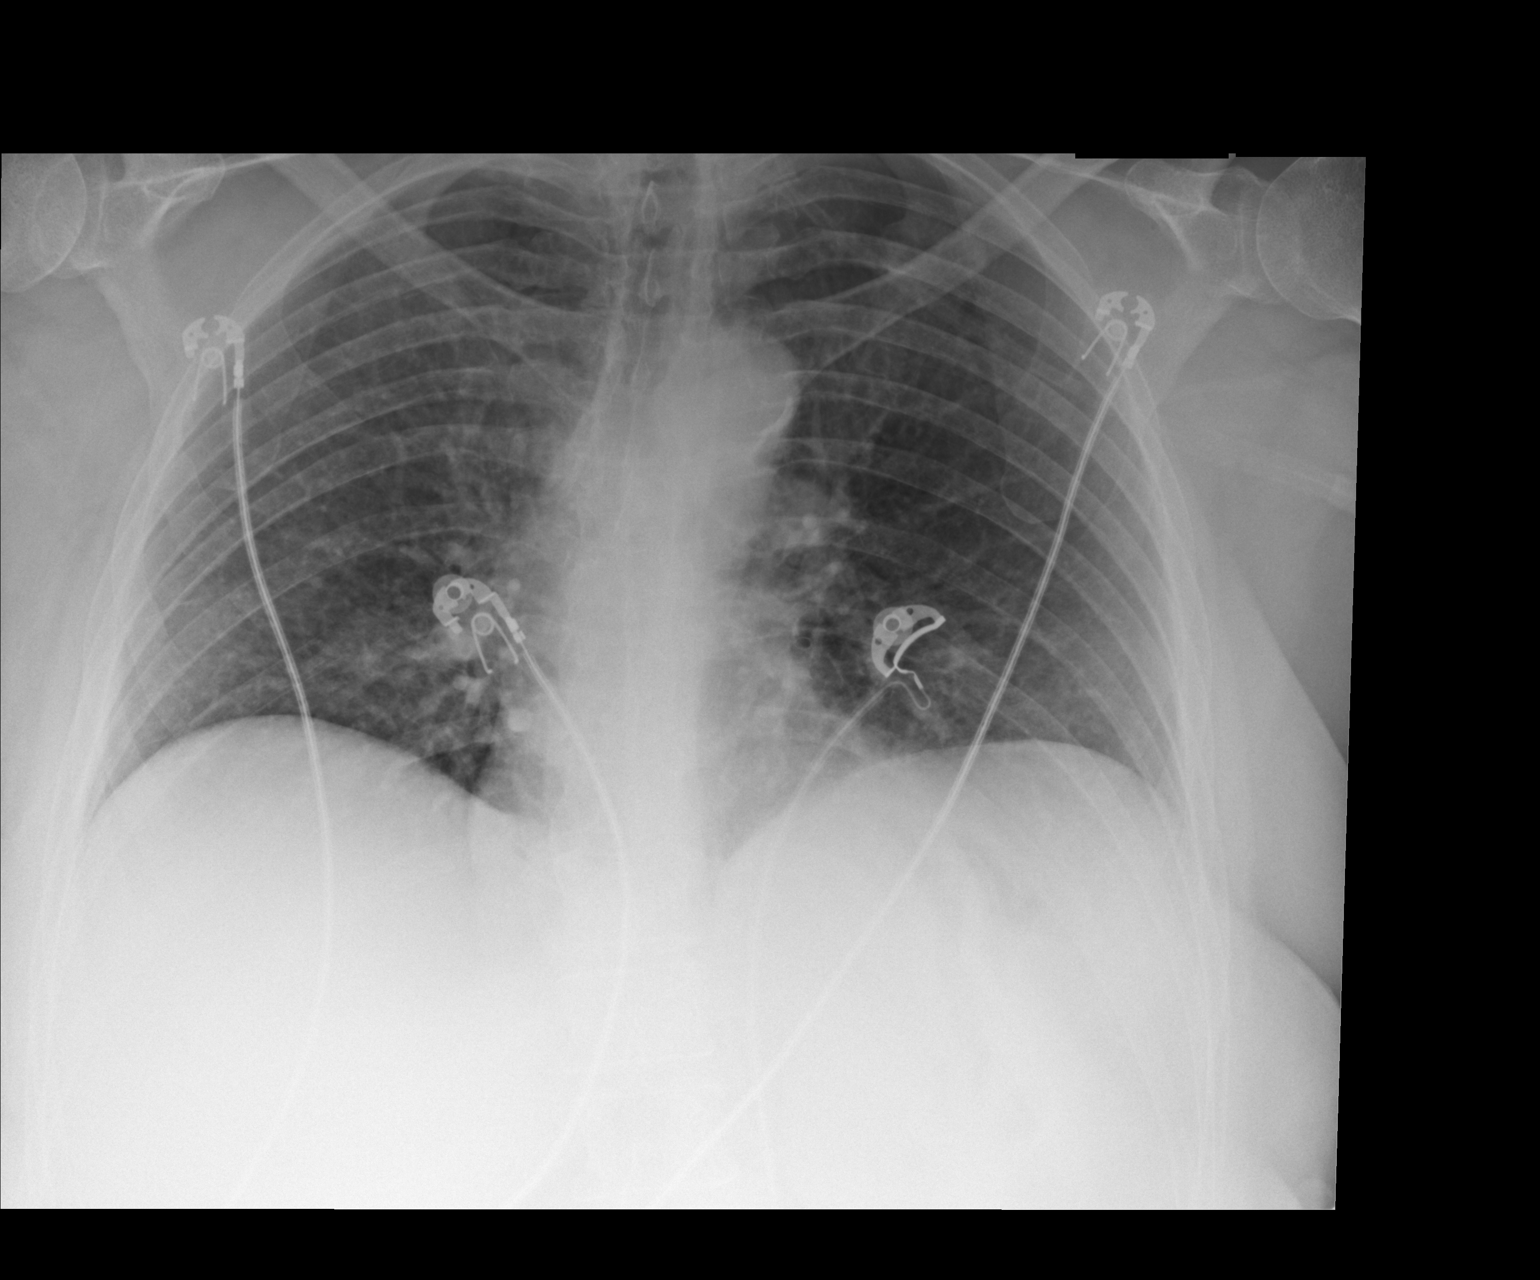

[1 of 1 positions shown; findings below may reference images not displayed]

FINDINGS: Lung volumes are relatively low.  No edema, infiltrate or
pleural fluid is seen.  Heart size is normal.
IMPRESSION: Low lung volumes.  No active disease.

## 2014-06-11 IMAGING — US US ABDOMEN COMPLETE
1 series · 13 of 25 positions shown · non-contrast
Comparison: Renal ultrasound 03/23/2012.  CT abdomen and pelvis
03/26/2008.

CLINICAL DATA: Epigastric pain and elevated white cell count.
History of gallbladder polyps.

COMPLETE ABDOMINAL ULTRASOUND

[Series 1: us abdomen complete · 0.28mm/px · 13 of 91 slices shown]
[im 1/91]
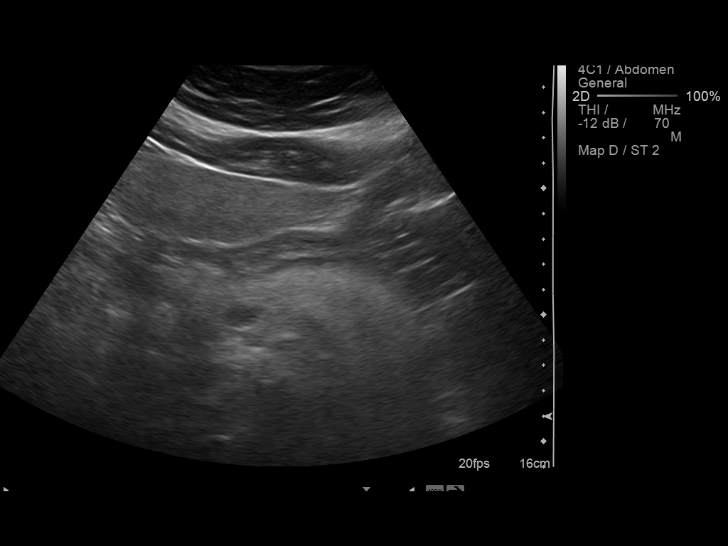
[im 8/91]
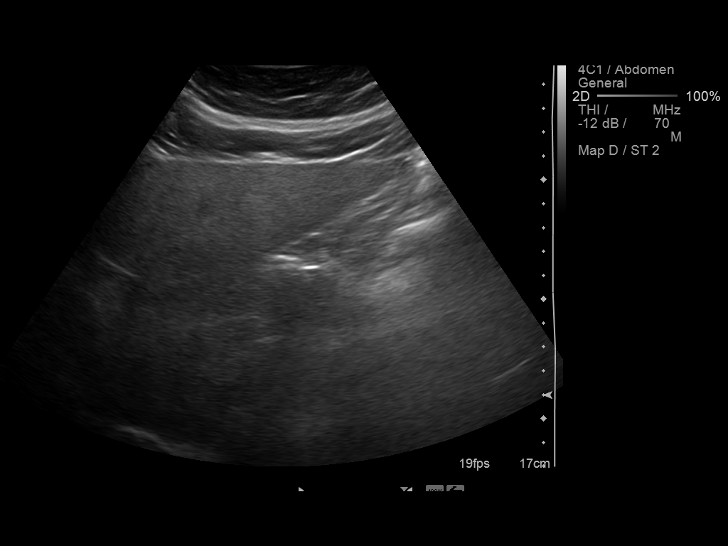
[im 16/91]
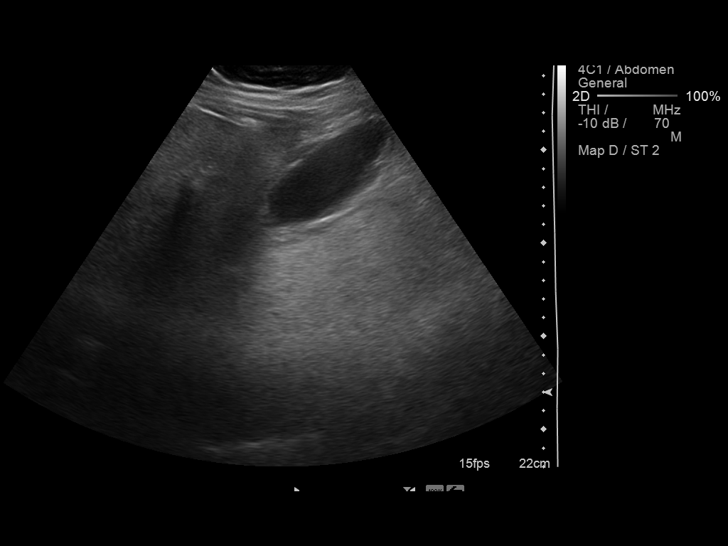
[im 23/91]
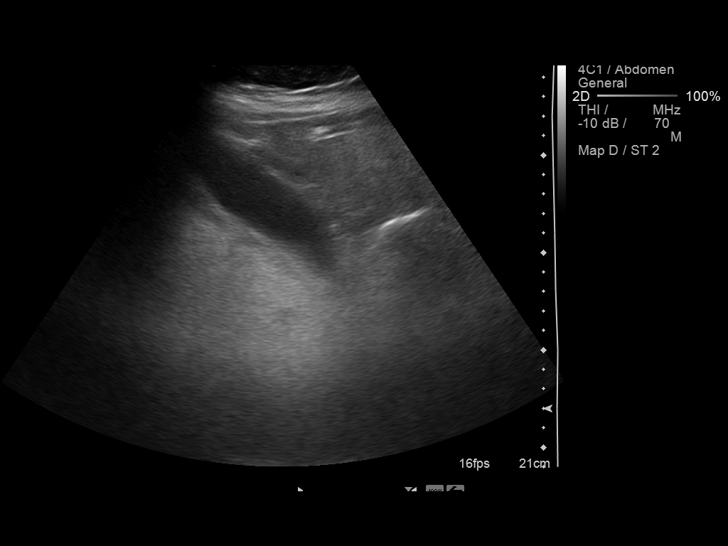
[im 31/91]
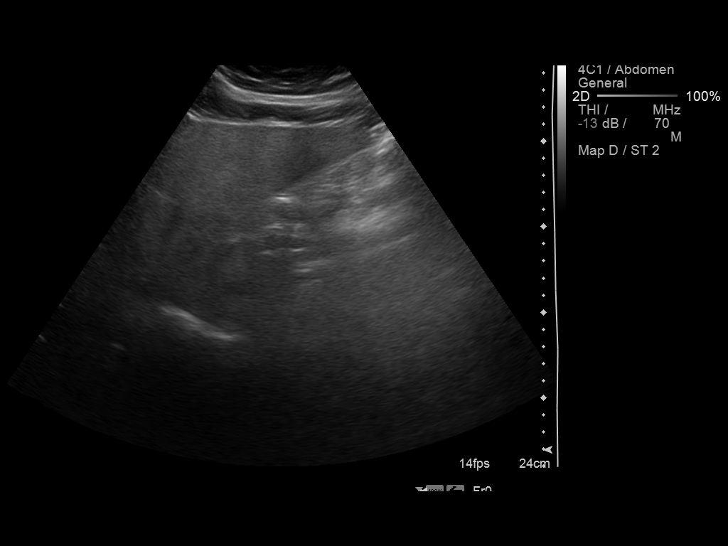
[im 38/91]
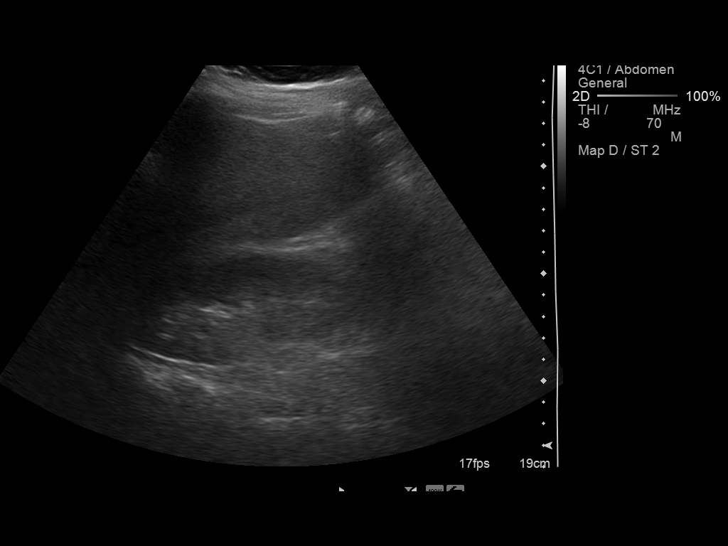
[im 46/91]
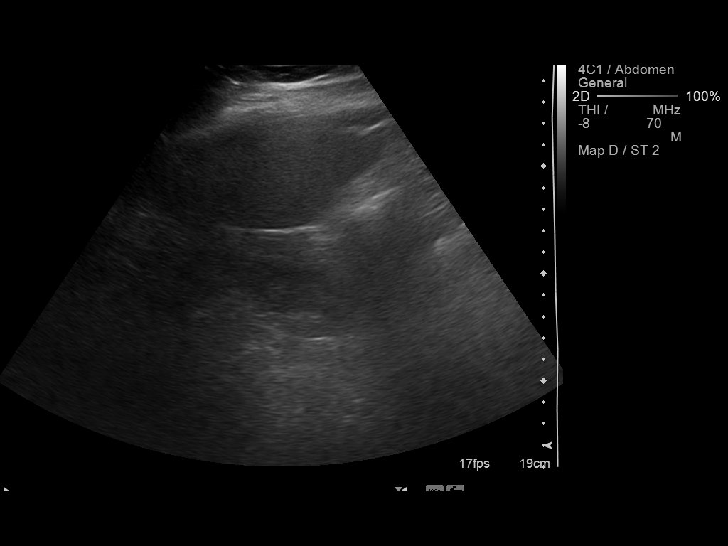
[im 53/91]
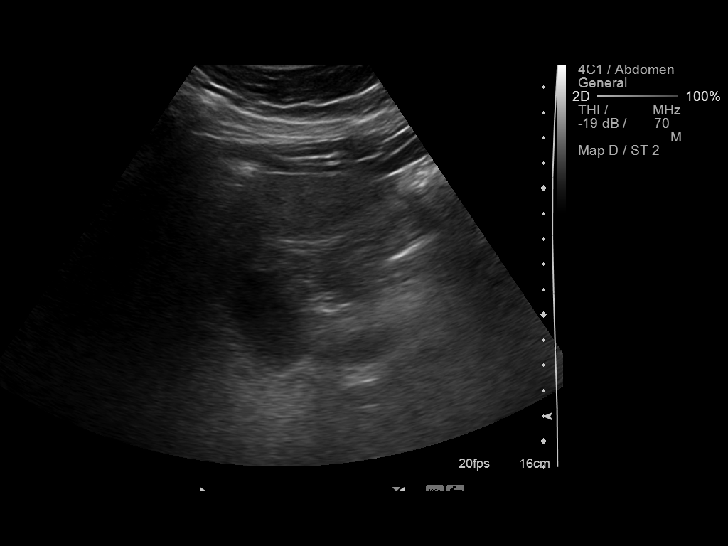
[im 61/91]
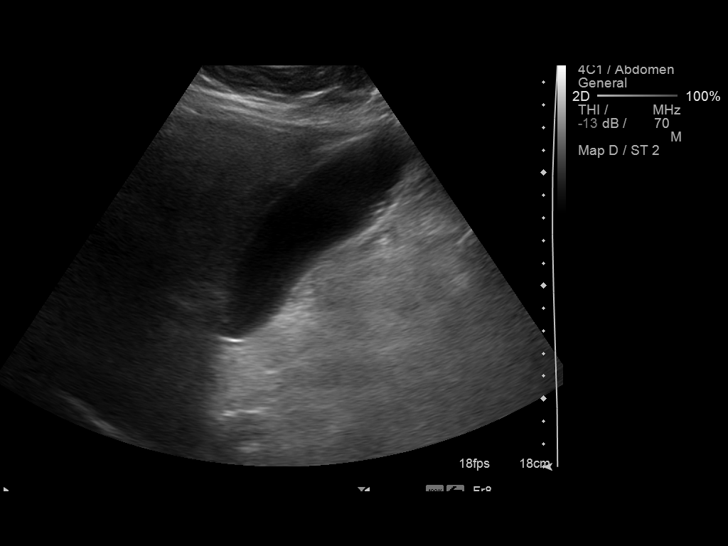
[im 68/91]
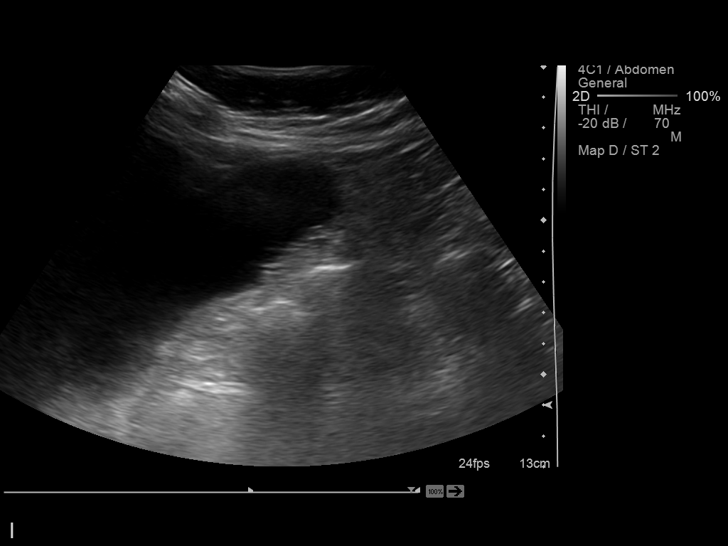
[im 76/91]
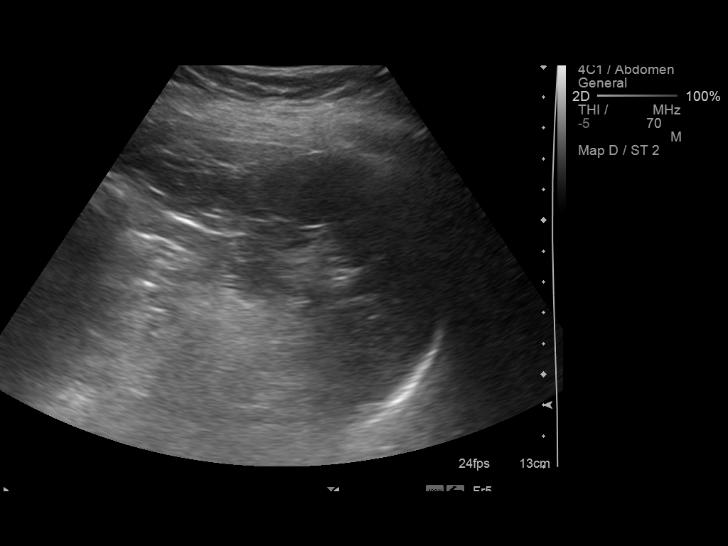
[im 83/91]
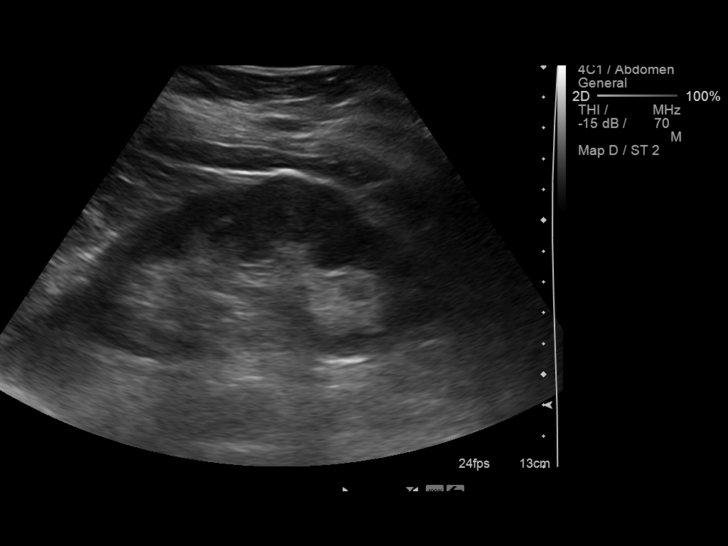
[im 91/91]
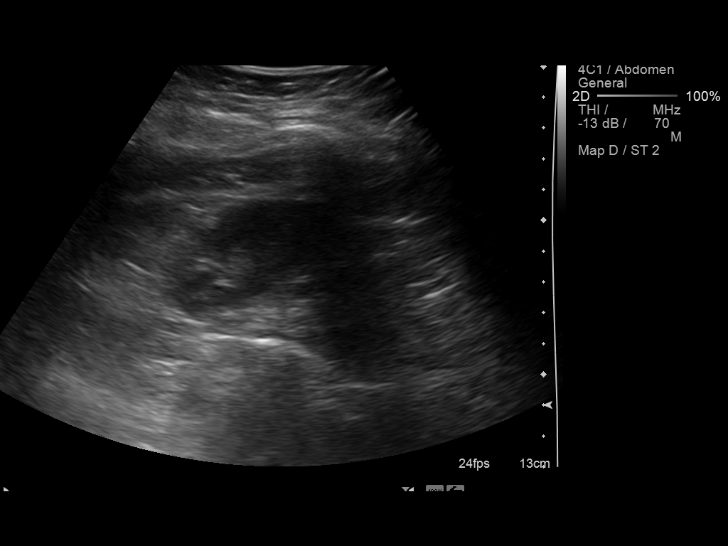

[13 of 25 positions shown; findings below may reference images not displayed]

FINDINGS: Gallbladder:  Three focal filling defects are demonstrated in the
upper portion of the gallbladder fundus, measuring 7 mm, 8 mm, and
6 mm respectively.  These do not appear changed in position during
examination and may represent polyps or non mobile stones.  This
finding is more prominent than on the previous ultrasound, or early
one small polyp was identified.  The polyp on the previous study
was in the same location.  Differential diagnosis includes interval
enlargement of polyps, development of stones or sludge, or
developing mass in the gallbladder.  Abdominal MRI / MRCP is
recommended for further evaluation to exclude early gallbladder
carcinoma. There is no gallbladder wall thickening, edema, or
sludge.

Common bile duct:  Normal caliber with measured diameter of 4 mm.

Liver:  Diffusely increased hepatic echotexture suggesting diffuse
fatty infiltration.

IVC:  Visualized portion of the inferior vena cava is unremarkable.

Pancreas:  Limited visualization of the pancreas due to overlying
bowel gas.  Visualized portions of the pancreas are unremarkable.

Spleen:  Spleen length measures 5.6 cm.  Normal parenchymal
echotexture.

Right Kidney:  Right kidney measures 10.6 cm length.  No
hydronephrosis.

Left Kidney:  Left kidney measures 11.3 cm length.  No
hydronephrosis.

Abdominal aorta:  Aorta is predominately obscured by overlying
bowel gas is not well visualized.
IMPRESSION: Enlarging polypoid lesion in the gallbladder versus developing
stones.  Non emergent follow-up imaging with MRI / MRCP is
recommended to exclude developing gallbladder mass.  No evidence of
acute cholecystitis.  Diffuse fatty infiltration of the liver.

## 2014-06-20 ENCOUNTER — Other Ambulatory Visit: Payer: Self-pay | Admitting: Gastroenterology

## 2014-06-23 ENCOUNTER — Encounter (HOSPITAL_COMMUNITY): Payer: Self-pay | Admitting: *Deleted

## 2014-06-24 ENCOUNTER — Encounter (HOSPITAL_COMMUNITY): Payer: Self-pay | Admitting: Pharmacy Technician

## 2014-06-25 NOTE — Anesthesia Preprocedure Evaluation (Addendum)
Anesthesia Evaluation  Patient identified by MRN, date of birth, ID band Patient awake    Reviewed: Allergy & Precautions, H&P , NPO status , Patient's Chart, lab work & pertinent test results, reviewed documented beta blocker date and time   Airway Mallampati: I TM Distance: >3 FB Neck ROM: Full    Dental  (+) Dental Advisory Given, Caps,  All front upper are capped:   Pulmonary neg pulmonary ROS, shortness of breath, with exertion and lying, Current Smoker,  breath sounds clear to auscultation  Pulmonary exam normal       Cardiovascular hypertension, Pt. on medications and Pt. on home beta blockers + CAD, + Past MI, + Cardiac Stents and + Peripheral Vascular Disease Rhythm:Regular Rate:Normal     Neuro/Psych negative neurological ROS  negative psych ROS   GI/Hepatic negative GI ROS, Neg liver ROS, hiatal hernia, GERD-  Medicated and Controlled,  Endo/Other  negative endocrine ROS  Renal/GU negative Renal ROS  negative genitourinary   Musculoskeletal negative musculoskeletal ROS (+)   Abdominal (+)  Abdomen: soft. Bowel sounds: normal.  Peds  Hematology negative hematology ROS (+)   Anesthesia Other Findings   Reproductive/Obstetrics negative OB ROS                          Anesthesia Physical Anesthesia Plan  ASA: III  Anesthesia Plan: MAC   Post-op Pain Management:    Induction:   Airway Management Planned:   Additional Equipment:   Intra-op Plan:   Post-operative Plan:   Informed Consent: I have reviewed the patients History and Physical, chart, labs and discussed the procedure including the risks, benefits and alternatives for the proposed anesthesia with the patient or authorized representative who has indicated his/her understanding and acceptance.   Dental Advisory Given  Plan Discussed with: CRNA and Surgeon  Anesthesia Plan Comments:         Anesthesia Quick  Evaluation

## 2014-06-26 ENCOUNTER — Encounter (HOSPITAL_COMMUNITY): Payer: Medicare Other | Admitting: Anesthesiology

## 2014-06-26 ENCOUNTER — Ambulatory Visit (HOSPITAL_COMMUNITY)
Admission: RE | Admit: 2014-06-26 | Discharge: 2014-06-26 | Disposition: A | Discharge status: Home / Self Care | Payer: Medicare Other | Source: Ambulatory Visit | Attending: Gastroenterology | Admitting: Gastroenterology

## 2014-06-26 ENCOUNTER — Ambulatory Visit (HOSPITAL_COMMUNITY): Payer: Medicare Other | Admitting: Anesthesiology

## 2014-06-26 ENCOUNTER — Encounter (HOSPITAL_COMMUNITY): Payer: Self-pay

## 2014-06-26 ENCOUNTER — Encounter (HOSPITAL_COMMUNITY)
Admission: RE | Disposition: A | Discharge status: Home / Self Care | Payer: Self-pay | Source: Ambulatory Visit | Attending: Gastroenterology

## 2014-06-26 DIAGNOSIS — G8929 Other chronic pain: Secondary | ICD-10-CM | POA: Insufficient documentation

## 2014-06-26 DIAGNOSIS — F17210 Nicotine dependence, cigarettes, uncomplicated: Secondary | ICD-10-CM | POA: Diagnosis not present

## 2014-06-26 DIAGNOSIS — I252 Old myocardial infarction: Secondary | ICD-10-CM | POA: Diagnosis not present

## 2014-06-26 DIAGNOSIS — K219 Gastro-esophageal reflux disease without esophagitis: Secondary | ICD-10-CM | POA: Diagnosis present

## 2014-06-26 DIAGNOSIS — Z1211 Encounter for screening for malignant neoplasm of colon: Secondary | ICD-10-CM | POA: Insufficient documentation

## 2014-06-26 DIAGNOSIS — D126 Benign neoplasm of colon, unspecified: Secondary | ICD-10-CM | POA: Diagnosis not present

## 2014-06-26 DIAGNOSIS — I739 Peripheral vascular disease, unspecified: Secondary | ICD-10-CM | POA: Diagnosis not present

## 2014-06-26 DIAGNOSIS — E780 Pure hypercholesterolemia: Secondary | ICD-10-CM | POA: Insufficient documentation

## 2014-06-26 DIAGNOSIS — M25559 Pain in unspecified hip: Secondary | ICD-10-CM | POA: Diagnosis not present

## 2014-06-26 DIAGNOSIS — K5730 Diverticulosis of large intestine without perforation or abscess without bleeding: Secondary | ICD-10-CM | POA: Diagnosis not present

## 2014-06-26 DIAGNOSIS — R0789 Other chest pain: Secondary | ICD-10-CM | POA: Diagnosis not present

## 2014-06-26 DIAGNOSIS — Z882 Allergy status to sulfonamides status: Secondary | ICD-10-CM | POA: Diagnosis not present

## 2014-06-26 DIAGNOSIS — I10 Essential (primary) hypertension: Secondary | ICD-10-CM | POA: Diagnosis not present

## 2014-06-26 DIAGNOSIS — K2950 Unspecified chronic gastritis without bleeding: Secondary | ICD-10-CM | POA: Diagnosis not present

## 2014-06-26 DIAGNOSIS — R0602 Shortness of breath: Secondary | ICD-10-CM | POA: Insufficient documentation

## 2014-06-26 DIAGNOSIS — I2510 Atherosclerotic heart disease of native coronary artery without angina pectoris: Secondary | ICD-10-CM | POA: Diagnosis not present

## 2014-06-26 DIAGNOSIS — Z880 Allergy status to penicillin: Secondary | ICD-10-CM | POA: Insufficient documentation

## 2014-06-26 DIAGNOSIS — Z881 Allergy status to other antibiotic agents status: Secondary | ICD-10-CM | POA: Insufficient documentation

## 2014-06-26 DIAGNOSIS — Z885 Allergy status to narcotic agent status: Secondary | ICD-10-CM | POA: Diagnosis not present

## 2014-06-26 DIAGNOSIS — F419 Anxiety disorder, unspecified: Secondary | ICD-10-CM | POA: Insufficient documentation

## 2014-06-26 HISTORY — DX: Other chest pain: R07.89

## 2014-06-26 HISTORY — DX: Other complications of anesthesia, initial encounter: T88.59XA

## 2014-06-26 HISTORY — DX: Shortness of breath: R06.02

## 2014-06-26 HISTORY — DX: Peripheral vascular disease, unspecified: I73.9

## 2014-06-26 HISTORY — DX: Anxiety disorder, unspecified: F41.9

## 2014-06-26 HISTORY — PX: COLONOSCOPY WITH PROPOFOL: SHX5780

## 2014-06-26 HISTORY — DX: Adverse effect of unspecified anesthetic, initial encounter: T41.45XA

## 2014-06-26 HISTORY — PX: ESOPHAGOGASTRODUODENOSCOPY (EGD) WITH PROPOFOL: SHX5813

## 2014-06-26 HISTORY — DX: Unspecified cataract: H26.9

## 2014-06-26 SURGERY — ESOPHAGOGASTRODUODENOSCOPY (EGD) WITH PROPOFOL
Anesthesia: Monitor Anesthesia Care

## 2014-06-26 MED ORDER — MIDAZOLAM HCL 2 MG/2ML IJ SOLN
INTRAMUSCULAR | Status: AC
  Filled 2014-06-26: qty 2

## 2014-06-26 MED ORDER — ATROPINE SULFATE 0.4 MG/ML IJ SOLN
INTRAMUSCULAR | Status: AC
  Filled 2014-06-26: qty 1

## 2014-06-26 MED ORDER — ONDANSETRON HCL 4 MG/2ML IJ SOLN
INTRAMUSCULAR | Status: DC | PRN
  Administered 2014-06-26: 4 mg via INTRAVENOUS

## 2014-06-26 MED ORDER — PROPOFOL INFUSION 10 MG/ML OPTIME
INTRAVENOUS | Status: DC | PRN
  Administered 2014-06-26: 200 ug/kg/min via INTRAVENOUS

## 2014-06-26 MED ORDER — PROPOFOL 10 MG/ML IV BOLUS
INTRAVENOUS | Status: DC | PRN
  Administered 2014-06-26 (×2): 20 mg via INTRAVENOUS
  Administered 2014-06-26: 10 mg via INTRAVENOUS
  Administered 2014-06-26: 20 mg via INTRAVENOUS
  Administered 2014-06-26: 10 mg via INTRAVENOUS

## 2014-06-26 MED ORDER — LACTATED RINGERS IV SOLN
INTRAVENOUS | Status: DC
  Administered 2014-06-26: 07:00:00 via INTRAVENOUS

## 2014-06-26 MED ORDER — FENTANYL CITRATE 0.05 MG/ML IJ SOLN: 25.0000 ug | INTRAMUSCULAR | Status: DC | PRN

## 2014-06-26 MED ORDER — SODIUM CHLORIDE 0.9 % IV SOLN: INTRAVENOUS | Status: DC

## 2014-06-26 MED ORDER — LIDOCAINE HCL (CARDIAC) 20 MG/ML IV SOLN
INTRAVENOUS | Status: AC
  Filled 2014-06-26: qty 5

## 2014-06-26 MED ORDER — PROPOFOL 10 MG/ML IV BOLUS
INTRAVENOUS | Status: AC
  Filled 2014-06-26: qty 20

## 2014-06-26 MED ORDER — LIDOCAINE VISCOUS 2 % MT SOLN
OROMUCOSAL | Status: AC
  Filled 2014-06-26: qty 15

## 2014-06-26 MED ORDER — ONDANSETRON HCL 4 MG/2ML IJ SOLN
INTRAMUSCULAR | Status: AC
  Filled 2014-06-26: qty 2

## 2014-06-26 MED ORDER — LIDOCAINE VISCOUS 2 % MT SOLN
OROMUCOSAL | Status: DC | PRN
  Administered 2014-06-26: 5 mL via OROMUCOSAL

## 2014-06-26 MED ORDER — LIDOCAINE HCL (CARDIAC) 20 MG/ML IV SOLN
INTRAVENOUS | Status: DC | PRN
  Administered 2014-06-26: 50 mg via INTRAVENOUS

## 2014-06-26 SURGICAL SUPPLY — 25 items

## 2014-06-26 NOTE — Transfer of Care (Signed)
Immediate Anesthesia Transfer of Care Note  Patient: [name]  Procedure(s) Performed: Procedure(s): ESOPHAGOGASTRODUODENOSCOPY (EGD) WITH PROPOFOL (N/A) COLONOSCOPY WITH PROPOFOL (N/A)  Patient Location: PACU and Endoscopy Unit  Anesthesia Type:MAC  Level of Consciousness: awake, oriented and patient cooperative  Airway & Oxygen Therapy: Patient Spontanous Breathing and Patient connected to nasal cannula oxygen  Post-op Assessment: Report given to PACU RN and Post -op Vital signs reviewed and stable  Post vital signs: Reviewed and stable  Complications: No apparent anesthesia complications

## 2014-06-26 NOTE — Op Note (Signed)
[place], [id number]   OPERATIVE PROCEDURE REPORT  PATIENT: Kristina, Ward  MR#: [id number] BIRTHDATE: July 02, 1951 GENDER: female ENDOSCOPIST: Edmonia James, MD ASSISTANT:   Jiles Harold, technician Lalla Brothers, RN PROCEDURE DATE: 07/03/14 PRE-PROCEDURE PREPARATION: The patient was prepped with a gallon of Golytely the night prior to the procedure.  The patient was fasted for 4 hours prior to the procedure. PRE-PROCEDURE PHYSICAL: Patient has stable vital signs.  Neck is supple.  There is no JVD, thyromegaly or LAD.  Chest clear to auscultation.  S1 and S2 regular.  Abdomen soft, non-distended, non-tender with NABS. PROCEDURE:     Colonoscopy with cold biopsies x 3. ASA CLASS:     Class IV INDICATIONS:     1.  Colorectal cancer screening. MEDICATIONS:     Monitored anesthesia care  DESCRIPTION OF PROCEDURE: After the risks, benefits, and alternatives of the procedure were thoroughly explained [including a 10% missed rate of cancer and polyps], informed consent was obtained.  Digital rectal exam was performed.  The [employer] Slim Colonicsope [id number])  was introduced through the anus  and advanced to the terminal ileum which was intubated for a short distance , limited by No adverse events experienced.  The quality of the prep was good. Multiple washes were done. Small lesions could be missed. The instrument was then slowly withdrawn as the colon was fully examined.     COLON FINDINGS: There was mild diverticulosis noted in the sigmoid colon. Three sessile polyps were found in the left colon and were removed by cold biopsies x 3-biopsies was performed using cold forceps. The rest of the colonic mucosa appeared healthy with a normal vascular pattern.  No massesr AVMs were noted.  The appendiceal orifice and the ICV were identified and photographed. The terminal ileum appeared normal. Retroflexed views revealed  no abnormalities. The patient tolerated the procedure without immediate complications.  The scope was then withdrawn from the patient and the procedure terminated.  TIME TO CECUM:  4 minutes 0 seconds WITHDRAW TIME:  8 minutes 0 seconds  IMPRESSION:     1) Mild diverticulosis was noted in the sigmoid colon. 2) Three small sessile polyps in the left colon removed by cold biopsies x 3. 3) Otherwise normal colon upto the terminal ileum.  RECOMMENDATIONS:     1.  Hold other NSAIDS for 2 weeks. 2.  Await biopsy results. 3.  Continue current medications. 4.  Continue surveillance. 5.  High fiber diet with liberal fluid intake. 6.  Out patient follow-up in 2 weeks.  REPEAT EXAM:      In 5-10 years  for colonoscopy.  If the patient has any abnormal GI symptoms in the interim, she have been advised to contact the office as soon as possible for further recommendations.   REFERRED RX:VQMGQ Terrence Dupont, M.D. Mali Badger, M.D.  eSigned:  Edmonia James, MD 07-03-14 8:42 AM   CPT CODES:     45380@Colonoscopy , flexible, proximal to splenic flexure; with biopsy, single or multiple ICD CODES:     K63.5, Z12.11  The ICD and CPT codes recommended by this software are interpretations from the data that the clinical staff has captured with the software.  The verification of the translation of this report to the ICD and CPT codes and modifiers is the sole responsibility of the health care institution and practicing physician where this report was generated.  [employer]. will not be held responsible for the validity  of the ICD and CPT codes included on this report.  AMA assumes no liability for data contained or not contained herein. CPT is a Designer, television/film set of the [employer].  PATIENT NAME:  Kristina, Ward MR#: [id number]

## 2014-06-26 NOTE — Discharge Instructions (Addendum)

## 2014-06-26 NOTE — Anesthesia Postprocedure Evaluation (Signed)
  Anesthesia Post-op Note  Patient: [name]  Procedure(s) Performed: Procedure(s) (LRB): ESOPHAGOGASTRODUODENOSCOPY (EGD) WITH PROPOFOL (N/A) COLONOSCOPY WITH PROPOFOL (N/A)  Patient Location: PACU  Anesthesia Type: MAC  Level of Consciousness: awake and alert   Airway and Oxygen Therapy: Patient Spontanous Breathing  Post-op Pain: mild  Post-op Assessment: Post-op Vital signs reviewed, Patient's Cardiovascular Status Stable, Respiratory Function Stable, Patent Airway and No signs of Nausea or vomiting  Last Vitals:  Filed Vitals:   06/26/14 0850  BP:   Pulse: 69  Temp:   Resp: 25    Post-op Vital Signs: stable   Complications: No apparent anesthesia complications

## 2014-06-26 NOTE — H&P (Signed)
[name] is an 63 y.o. female.   Chief Complaint: Chest pain/CRC screening.  HPI: 63 year old white female, with multiple medical problems listed below, presents to the hospital for an EGD/Colonoscopy. See office notes for details.  Past Medical History  Diagnosis Date  . Hypertension   . GERD (gastroesophageal reflux disease)   . Hypercholesteremia   . Chronic hip pain   . H/O hiatal hernia   . Shortness of breath     exertional, occ. periods of wheezing -"uses inhaler"  . Anxiety   . Chest discomfort     currently remains with "chest burning" feeling  . Arthritis     neck, shoulders-"thinks all joints"occ.uses cane  . Cataracts, both eyes     surgical, can't afford to have removed.  . Peripheral vascular disease     "needs to have bypass surgery for leg circulation" toes are cold and tingles occ.  Marland Kitchen CAD (coronary artery disease)   . ST elevation myocardial infarction (STEMI) of inferoposterior wall September 2013    S/P DES X 3 RCA, EF 50%  . Complication of anesthesia     laryngospasm when she had her tubal ligation   Past Surgical History  Procedure Laterality Date  . Tonsillectomy    . Appendectomy    . Vulvectomy      "constant inner vaginal burning"- no longer a problem  . Cholecystectomy N/A 12/31/2012    Procedure: LAPAROSCOPIC CHOLECYSTECTOMY;  Surgeon: Harl Bowie, MD;  Location: [facility];  Service: General;  Laterality: N/A;  . Tubal ligation    . Breast surgery      breast augmentation  . Cardiac catheterization      stents x3 " all clogged" states 06-23-14. "has collateral circulation   Family History  Problem Relation Age of Onset  . Hypertension Mother   . Heart disease Mother     before age 19   . Cancer Father   . Diabetes Father   . Hypertension Father   . Hyperlipidemia Father   . Varicose Veins Father   . Heart attack Father   . Hypertension Sister   . Hypertension Brother   . Heart disease Brother     before age 101  . Heart  attack Brother   . Hyperlipidemia Son   . Peripheral vascular disease Son    Social History:  reports that she has been smoking Cigarettes.  She has a 25 pack-year smoking history. She has never used smokeless tobacco. She reports that she does not drink alcohol or use illicit drugs.  Allergies:  Allergies  Allergen Reactions  . Ciprofloxacin Anaphylaxis  . Erythromycin Nausea And Vomiting  . Morphine And Related Other (See Comments)    Doesn't work for patient at all  . Codeine Other (See Comments)    Unknown reaction  . Penicillins Rash  . Sulfa Antibiotics Rash    Medications Prior to Admission  Medication Sig Dispense Refill  . acetaminophen (TYLENOL) 500 MG tablet Take 500-1,000 mg by mouth every 4 (four) hours as needed for pain.       Marland Kitchen albuterol (PROVENTIL HFA;VENTOLIN HFA) 108 (90 BASE) MCG/ACT inhaler Inhale 2 puffs into the lungs 2 (two) times daily as needed for wheezing or shortness of breath. For wheezing      . amLODipine (NORVASC) 10 MG tablet Take 1 tablet by mouth every morning.       Marland Kitchen aspirin 81 MG tablet Take 81 mg by mouth every morning.       Marland Kitchen  atorvastatin (LIPITOR) 40 MG tablet Take 1 tablet by mouth daily at 6 PM.       . budesonide-formoterol (SYMBICORT) 160-4.5 MCG/ACT inhaler Inhale 2 puffs into the lungs 2 (two) times daily.       . clonazePAM (KLONOPIN) 2 MG tablet Take 1 mg by mouth 2 (two) times daily. (Some times take another half in between)      . esomeprazole (NEXIUM) 40 MG capsule Take 40 mg by mouth 2 (two) times daily.       Marland Kitchen lisinopril (PRINIVIL,ZESTRIL) 10 MG tablet Take 10 mg by mouth every evening.      . metoprolol tartrate (LOPRESSOR) 25 MG tablet Take 25 mg by mouth 2 (two) times daily.       . nitroGLYCERIN (NITRO-DUR) 0.2 mg/hr patch Place 1 patch (0.2 mg total) onto the skin daily.  30 patch  12  . traMADol (ULTRAM) 50 MG tablet Take 50 mg by mouth every 6 (six) hours as needed for moderate pain.       . cholestyramine (QUESTRAN) 4 G  packet Take 4 g by mouth 2 (two) times daily.      . nitroGLYCERIN (NITROSTAT) 0.4 MG SL tablet Place 1 tablet (0.4 mg total) under the tongue every 5 (five) minutes x 3 doses as needed for chest pain.  25 tablet  3  . Polyethyl Glycol-Propyl Glycol (SYSTANE) 0.4-0.3 % SOLN Apply 1 drop to eye daily as needed (for dry eyes).      . ranitidine (ZANTAC) 150 MG tablet Take 150 mg by mouth daily as needed for heartburn.        No results found for this or any previous visit (from the past 48 hour(s)). No results found.  Review of Systems  Constitutional: Negative.   HENT: Negative.   Eyes: Negative.   Respiratory: Negative.   Cardiovascular: Positive for chest pain.  Gastrointestinal: Positive for heartburn and diarrhea. Negative for nausea, vomiting, abdominal pain, constipation, blood in stool and melena.  Skin: Negative.   Neurological: Negative.   Endo/Heme/Allergies: Negative.   Psychiatric/Behavioral: Positive for depression. Negative for suicidal ideas, hallucinations, memory loss and substance abuse. The patient is nervous/anxious and has insomnia.    Blood pressure 104/73, temperature 97.8 F (36.6 C), temperature source Oral, resp. rate 15, SpO2 97.00%. Physical Exam  Constitutional: She is oriented to person, place, and time. She appears well-developed and well-nourished.  Eyes: Conjunctivae and EOM are normal. Pupils are equal, round, and reactive to light.  Neck: Normal range of motion. Neck supple.  Cardiovascular: Normal rate and regular rhythm.   Respiratory: Effort normal and breath sounds normal.  GI: Soft. Bowel sounds are normal.  Musculoskeletal: Normal range of motion.  Neurological: She is alert and oriented to person, place, and time.  Skin: Skin is warm and dry.  Psychiatric: She has a normal mood and affect. Her behavior is normal. Judgment and thought content normal.    Assessment/Plan Colorectal cancer screening/GERD: Proceed with an EGD/Colonoscopy at this  time. [name] 06/26/2014, 7:32 AM

## 2014-06-26 NOTE — Op Note (Addendum)
[place], [id number]   OPERATIVE PROCEDURE REPORT  PATIENT :Norvella, Loscalzo  MR#: [id number] BIRTHDATE :1950-10-12 GENDER: female ENDOSCOPIST: Edmonia James, MD ASSISTANT:   Jiles Harold technician, Lalla Brothers, RN PROCEDURE DATE: Jul 14, 2014 PRE-PROCEDURE PREPERATION: Patient fasted for 4 hours prior to procedure. PRE-PROCEDURE PHYSICAL: Patient has stable vital signs.  Neck is supple.  There is no JVD, thyromegaly or LAD.  Chest clear to auscultation.  S1 and S2 regular.  Abdomen soft, non-distended, non-tender with NABS. PROCEDURE:     EGD with cold biopsies x 4 ASA CLASS:     Class IV INDICATIONS:     GERD. MEDICATIONS:     Monitored anesthesia care TOPICAL ANESTHETIC:   Viscous Xylocaine-10 cc PO  DESCRIPTION OF PROCEDURE:   After the risks benefits and alternatives of the procedure were thoroughly explained, informed consent was obtained.  The [id number])  was introduced through the mouth and advanced to the second portion of the duodenum , without limitations. The instrument was slowly withdrawn as the mucosa was fully examined.    ESOPHAGUS: The mucosa of the esophagus appeared normal.  STOMACH: Moderate diffuse gastritis was found-biopsies done.  DUODENUM: The duodenal mucosa showed no abnormalities.  Except for moderate diffuse gastritis, the esophagus, stomach and the proximal small bowel appeared normal. There were no ulcers, erosions, masses or polyps noted. Retroflexed views revealed no abnormalities. . The scope was then withdrawn from the patient and the procedure terminated. The patient tolerated the procedure without immediate complications.  IMPRESSION:  Moderate  diffuse gastritis; otherwise normal EGD.  RECOMMENDATIONS:     1.  Anti-reflux regimen to be followed. 2.  Avoid NSAIDS for two weeks. 3.  Await pathology results. 4.  Continue current medications. 5.  Continue PPI's. 6.  OP  follow-up in 2 weeks.  REPEAT EXAM:  None planned for now.  DISCHARGE INSTRUCTIONS: standard discharge instructions given.  _______________________________ eSignedEdmonia James, MD 07-14-2014 8:31 AM Revised: 07/14/14 8:31 AM  CPT CODES:     93790 DIAGNOSIS CODES:     K21.9 Gastro-esophageal reflux disease without esophagitis  The ICD and CPT codes recommended by this software are interpretations from the data that the clinical staff has captured with the software.  The verification of the translation of this report to the ICD and CPT codes and modifiers is the sole responsibility of the health care institution and practicing physician where this report was generated.  [employer]. will not be held responsible for the validity of the ICD and CPT codes included on this report.  AMA assumes no liability for data contained or not contained herein. CPT is a Designer, television/film set of the [employer].   CC: Charolette Forward, M.D. Mali Badger, M.D.  PATIENT NAME:  Adellyn, Capek MR#: [id number]

## 2014-06-27 ENCOUNTER — Encounter (HOSPITAL_COMMUNITY): Payer: Self-pay | Admitting: Gastroenterology

## 2014-08-14 ENCOUNTER — Encounter (HOSPITAL_COMMUNITY): Payer: Self-pay | Admitting: Cardiology

## 2015-01-09 NOTE — Progress Notes (Signed)
Pt. was evaluated 04/25/2014 per Dr. Bridgett Larsson; diagnosis Aortoiliac Bilateral aortoiliac disease with high grade R CIA Stenosis.  Was recommended to have Left-Right Fem-Fem BPG.  Several attempts have been made to contact pt., with no return phone call rec'd. from her.   Left voice message to call office and schedule f/u appt. with Dr. Bridgett Larsson, if she wishes to schedule surgery.

## 2015-09-11 IMAGING — CR DG CHEST 1V PORT
1 series · 1 of 1 positions shown · non-contrast
Comparison: None.

CLINICAL DATA: Chest pain and left arm numbness.

EXAM:
PORTABLE CHEST - 1 VIEW

[AP]
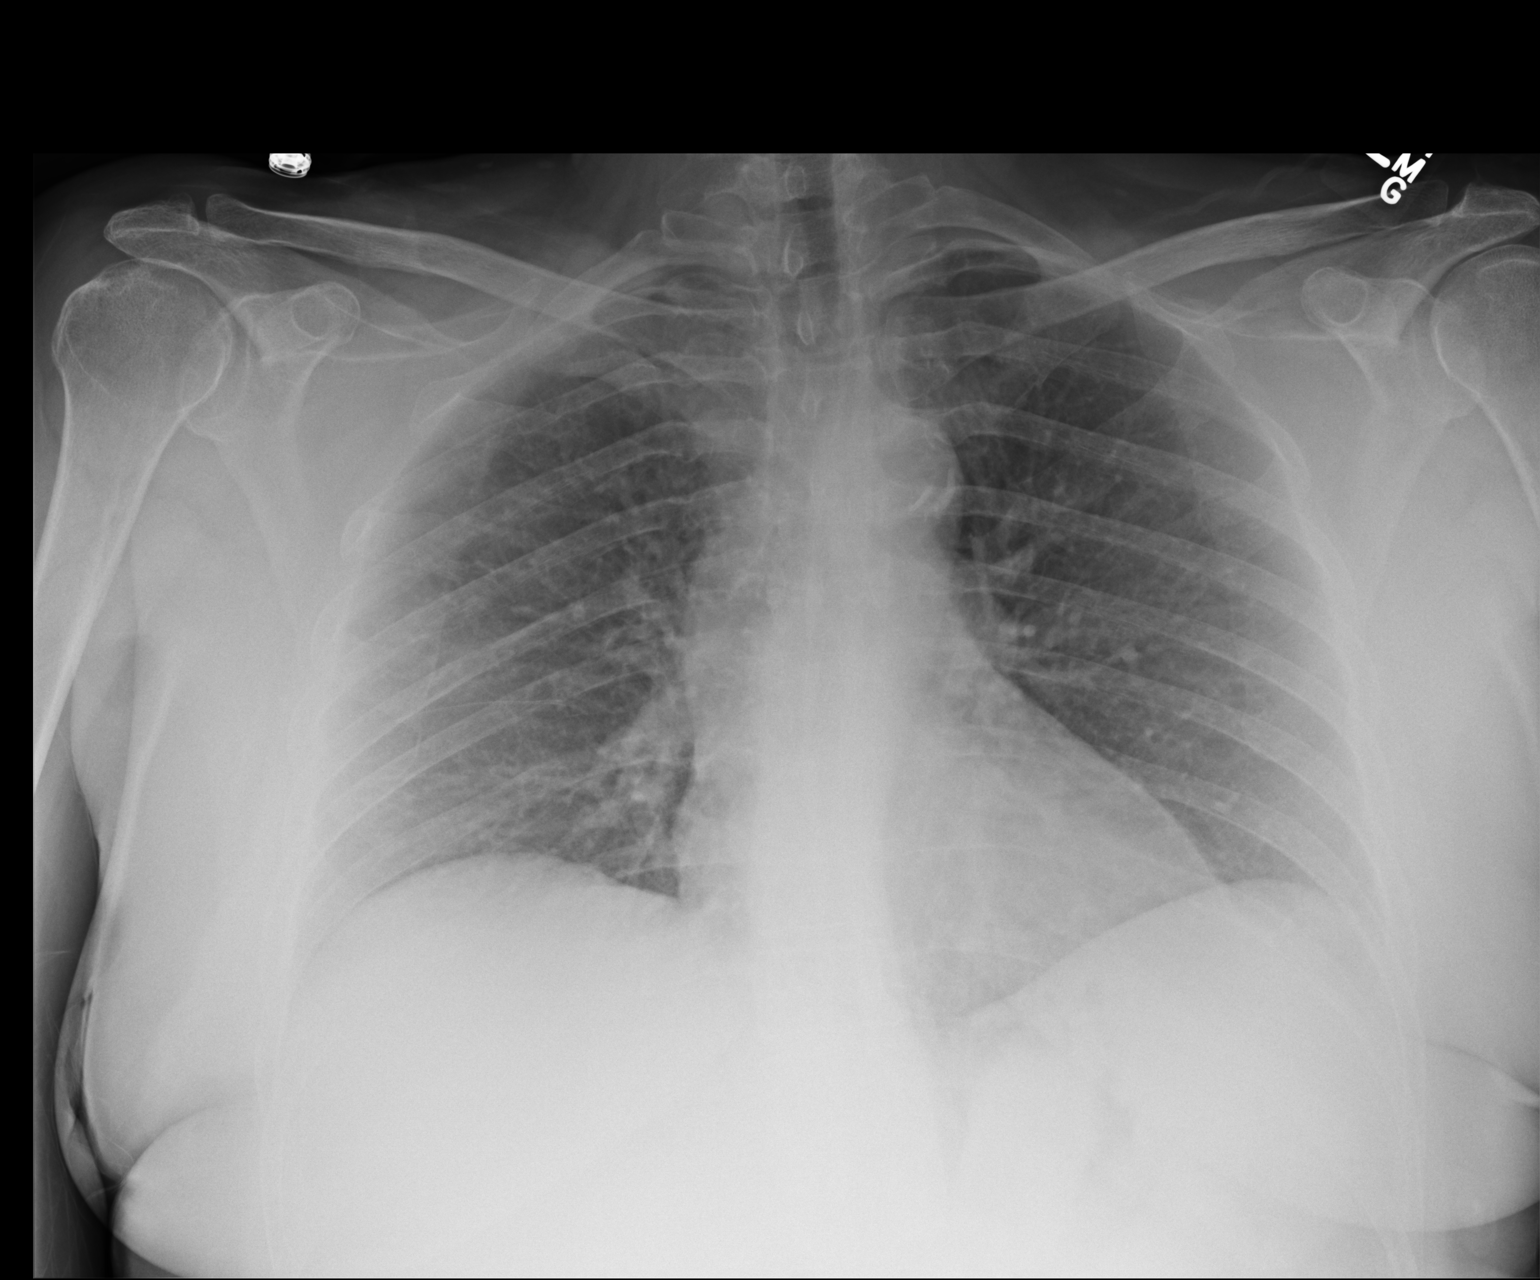

[1 of 1 positions shown; findings below may reference images not displayed]

FINDINGS: Cardiac silhouette is unremarkable for this low inspiratory portable
examination with crowded vasculature markings. Mildly calcified
aortic knob. The lungs are clear without pleural effusions or focal
consolidations. Trachea projects midline and there is no
pneumothorax. Included soft tissue planes and osseous structures are
non-suspicious. Mild dextroscoliosis.
IMPRESSION: No acute cardiopulmonary process for this low inspiratory portable
examination.

  By: Bongumsa Dorcus

## 2015-09-22 ENCOUNTER — Encounter: Payer: Self-pay | Admitting: Cardiovascular Disease

## 2016-01-14 ENCOUNTER — Encounter: Payer: Self-pay | Admitting: Internal Medicine

## 2016-01-14 ENCOUNTER — Ambulatory Visit (INDEPENDENT_AMBULATORY_CARE_PROVIDER_SITE_OTHER): Payer: Medicare Other | Admitting: Internal Medicine

## 2016-01-14 VITALS — BP 119/7 | HR 80 | Temp 97.9°F | Wt 189.0 lb

## 2016-01-14 DIAGNOSIS — N390 Urinary tract infection, site not specified: Secondary | ICD-10-CM

## 2016-01-14 DIAGNOSIS — R3915 Urgency of urination: Secondary | ICD-10-CM

## 2016-01-14 MED ORDER — ESTROGENS, CONJUGATED 0.625 MG/GM VA CREA: 1.0000 | TOPICAL_CREAM | Freq: Every day | VAGINAL | Status: AC

## 2016-01-14 NOTE — Progress Notes (Signed)
rfv = recurrent uti Subjective:    Patient ID: [name], female    DOB: 09/04/1951, 65 y.o.   MRN: [id number]  HPI 65yo F with history of HTN, GERD, vascular disease, fibromyalgia, who reports having recurrent uti, where she last took abtx roughly 6-8 months ago. She states that she took nitrofurantoin which did not help her symptoms. Her pcp referred her here due to her numerous abtx allergies. She states that her urinary symptoms include urinary frequency. She denies dysuria, no overt blood in urine. She has ongoing urinary urgency, which she states are her uti symptoms, on going for the past year. On microscopy from her pcp office records, occ has few leukocytes, trace blood, no nitrites. Culture results were not sent.  She denies fever, chills, nightsweats, flank pain.  Allergies  Allergen Reactions  . Ciprofloxacin Anaphylaxis  . Erythromycin Nausea And Vomiting  . Morphine And Related Other (See Comments)    Doesn't work for patient at all  . Codeine Other (See Comments)    Unknown reaction  . Penicillins Rash  . Sulfa Antibiotics Rash   Current Outpatient Prescriptions on File Prior to Visit  Medication Sig Dispense Refill  . acetaminophen (TYLENOL) 500 MG tablet Take 500-1,000 mg by mouth every 4 (four) hours as needed for pain.     Marland Kitchen albuterol (PROVENTIL HFA;VENTOLIN HFA) 108 (90 BASE) MCG/ACT inhaler Inhale 2 puffs into the lungs 2 (two) times daily as needed for wheezing or shortness of breath. For wheezing    . amLODipine (NORVASC) 10 MG tablet Take 1 tablet by mouth every morning.     Marland Kitchen aspirin 81 MG tablet Take 81 mg by mouth every morning.     Marland Kitchen atorvastatin (LIPITOR) 40 MG tablet Take 1 tablet by mouth daily at 6 PM.     . budesonide-formoterol (SYMBICORT) 160-4.5 MCG/ACT inhaler Inhale 2 puffs into the lungs 2 (two) times daily.     . clonazePAM (KLONOPIN) 2 MG tablet Take 1 mg by mouth 2 (two) times daily. (Some times take another half in between)    .  esomeprazole (NEXIUM) 40 MG capsule Take 40 mg by mouth 2 (two) times daily.     Marland Kitchen lisinopril (PRINIVIL,ZESTRIL) 10 MG tablet Take 10 mg by mouth every evening.    . metoprolol tartrate (LOPRESSOR) 25 MG tablet Take 25 mg by mouth 2 (two) times daily.     . nitroGLYCERIN (NITRO-DUR) 0.2 mg/hr patch Place 1 patch (0.2 mg total) onto the skin daily. 30 patch 12  . nitroGLYCERIN (NITROSTAT) 0.4 MG SL tablet Place 1 tablet (0.4 mg total) under the tongue every 5 (five) minutes x 3 doses as needed for chest pain. 25 tablet 3  . Polyethyl Glycol-Propyl Glycol (SYSTANE) 0.4-0.3 % SOLN Apply 1 drop to eye daily as needed (for dry eyes).    . ranitidine (ZANTAC) 150 MG tablet Take 150 mg by mouth daily as needed for heartburn.     . cholestyramine (QUESTRAN) 4 G packet Take 4 g by mouth 2 (two) times daily. Reported on 01/14/2016    . traMADol (ULTRAM) 50 MG tablet Take 50 mg by mouth every 6 (six) hours as needed for moderate pain. Reported on 01/14/2016     No current facility-administered medications on file prior to visit.   Active Ambulatory Problems    Diagnosis Date Noted  . Chest pain, mid sternal 08/30/2012  . GERD (gastroesophageal reflux disease)   . Hypertension   . Atherosclerosis of native arteries  of the extremities with intermittent claudication 01/31/2014  . Chest pain 03/31/2014  . Unstable angina ([place]) 03/31/2014   Resolved Ambulatory Problems    Diagnosis Date Noted  . No Resolved Ambulatory Problems   Past Medical History  Diagnosis Date  . Hypercholesteremia   . Chronic hip pain   . H/O hiatal hernia   . Shortness of breath   . Anxiety   . Chest discomfort   . Arthritis   . Cataracts, both eyes   . Peripheral vascular disease ([place])   . CAD (coronary artery disease)   . ST elevation myocardial infarction (STEMI) of inferoposterior wall [facility]) September 2013  . Complication of anesthesia    Social History  Substance Use Topics  . Smoking status: Current Every Day  Smoker -- 0.50 packs/day for 50 years    Types: Cigarettes  . Smokeless tobacco: Never Used     Comment: trying to , occ. uses E cigs  . Alcohol Use: No   family history includes Cancer in her father; Diabetes in her father; Heart attack in her brother and father; Heart disease in her brother and mother; Hyperlipidemia in her father and son; Hypertension in her brother, father, mother, and sister; Peripheral vascular disease in her son; Varicose Veins in her father.  Review of Systems Urgency, also has ongoing hip pain for osteoarthritis, and low back pain. Claudicaiton. Otherwise 10 point ros is negative    Objective:   Physical Exam BP 119/7 mmHg  Pulse 80  Temp(Src) 97.9 F (36.6 C) (Oral)  Wt 189 lb (85.73 kg) Physical Exam  Constitutional:  oriented to person, place, and time. appears well-developed and well-nourished. No distress.  HENT: /AT, PERRLA, no scleral icterus Mouth/Throat: Oropharynx is clear and moist. No oropharyngeal exudate.  Cardiovascular: Normal rate, regular rhythm and normal heart sounds. Exam reveals no gallop and no friction rub.  No murmur heard.  Pulmonary/Chest: Effort normal and breath sounds normal. No respiratory distress.  has no wheezes.  Neck = supple, no nuchal rigidity Abdominal: Soft. Bowel sounds are normal.  exhibits no distension. There is no tenderness.  Lymphadenopathy: no cervical adenopathy. No axillary adenopathy Neurological: alert and oriented to person, place, and time.  Skin: Skin is warm and dry. No rash noted. No erythema.  Psychiatric: a normal mood and affect.  behavior is normal.         Assessment & Plan:  Chronic urinary urgency +/- recurrent UTI =  - will check UA nad urine microscopy. She is not overly symptomatic other than urinary urgency that has been on going for many months, 6-9 months. Her last course of abtx for uti has been roughly 6 months ago. She has had repeated urinary cultures drawn at her pcp, not  always isolating bacteria, though she mentions they consistently isolate blood. She has not seen urology to evaluate for any structural or bladder dysfunction  - will do a trial of premarin vaginal suppositories to see if that may help with any atrophy of labia/genital tissue that can help minimize some discomfort  - she carries multiple drug allergies, which would use for UTI, she appears to have legitimate allergy to sulfa and fluoroquinolones. She has rash to amoxicillin though is unclear if she tolerates cephalosporins. In the event she has sensitive uropathogen, i would do a trial of furantoin, fosfomycin and possibly cephalosporins.  - we will recommend that she gets referred to urology for work up of chronic urinary urgency   It has been a pleasure  to see [name]. Please feel free to contact us if need assistance with determining need for abtx choice given her allergies. At this moment, I do not feel that she needs abtx.

## 2016-01-15 LAB — URINALYSIS, MICROSCOPIC ONLY
CASTS: NONE SEEN [LPF]
CRYSTALS: NONE SEEN [HPF]
SQUAMOUS EPITHELIAL / LPF: NONE SEEN [HPF] (ref <=5)
YEAST: NONE SEEN [HPF]

## 2016-01-15 LAB — URINALYSIS, ROUTINE W REFLEX MICROSCOPIC
Bilirubin Urine: NEGATIVE
GLUCOSE, UA: NEGATIVE
Ketones, ur: NEGATIVE
Nitrite: NEGATIVE
PH: 5.5 (ref 5.0–8.0)
Protein, ur: NEGATIVE
Specific Gravity, Urine: 1.017 (ref 1.001–1.035)

## 2016-02-05 ENCOUNTER — Encounter: Payer: Self-pay | Admitting: Vascular Surgery

## 2016-02-11 DIAGNOSIS — I7409 Other arterial embolism and thrombosis of abdominal aorta: Secondary | ICD-10-CM | POA: Insufficient documentation

## 2016-02-11 NOTE — Progress Notes (Signed)
Established Intermittent Claudication  History of Present Illness  [name] is a 65 y.o. (1951-05-29) female who presents with chief complaint: urinary urgency.  This patient initially presented in 2015 with intermittent claudication sx.  Her angiogram demonstrated distal aortic narrowing with a R CIA >90% stenosed.  I did not think this patient could safely be stented due to risk of aortic rupture as a kissing stent technique was going to be necessary.  I recommended: left to right femorofemoral bypass to the patient.  She has been lost to follow up since then.  She did not see a Cardiologist for preoperative risk stratification.   The patient's symptoms have not progressed.  The patient's symptoms are: intermittent claudication.  The patient ontinues to smoke.  The patient's treatment regimen currently included: maximal medical management.  This patient is in the process of evaluation by ID and Urology for recurrent UTI and urinary urgency.  The patient's PMH, PSH, and SH, and FamHx are unchanged from 04/25/14 .   Current Outpatient Prescriptions  Medication Sig Dispense Refill  . acetaminophen (TYLENOL) 500 MG tablet Take 500-1,000 mg by mouth every 4 (four) hours as needed for pain.     Marland Kitchen albuterol (PROVENTIL HFA;VENTOLIN HFA) 108 (90 BASE) MCG/ACT inhaler Inhale 2 puffs into the lungs 2 (two) times daily as needed for wheezing or shortness of breath. For wheezing    . amLODipine (NORVASC) 10 MG tablet Take 1 tablet by mouth every morning.     Marland Kitchen aspirin 81 MG tablet Take 81 mg by mouth every morning.     Marland Kitchen atorvastatin (LIPITOR) 40 MG tablet Take 1 tablet by mouth daily at 6 PM.     . budesonide-formoterol (SYMBICORT) 160-4.5 MCG/ACT inhaler Inhale 2 puffs into the lungs 2 (two) times daily.     . cholestyramine (QUESTRAN) 4 G packet Take 4 g by mouth 2 (two) times daily. Reported on 01/14/2016    . clonazePAM (KLONOPIN) 2 MG tablet Take 1 mg by mouth 2 (two) times daily. (Some times  take another half in between)    . conjugated estrogens (PREMARIN) vaginal cream Place 1 Applicatorful vaginally daily. 42.5 g 12  . DULoxetine (CYMBALTA) 30 MG capsule Take 30 mg by mouth 2 (two) times daily. Reported on 01/14/2016    . esomeprazole (NEXIUM) 40 MG capsule Take 40 mg by mouth 2 (two) times daily.     Marland Kitchen HYDROcodone-acetaminophen (NORCO) 10-325 MG tablet Take 1 tablet by mouth every 6 (six) hours as needed for moderate pain.    Marland Kitchen lisinopril (PRINIVIL,ZESTRIL) 10 MG tablet Take 10 mg by mouth every evening.    . metoprolol tartrate (LOPRESSOR) 25 MG tablet Take 25 mg by mouth 2 (two) times daily.     . nitroGLYCERIN (NITRO-DUR) 0.2 mg/hr patch Place 1 patch (0.2 mg total) onto the skin daily. 30 patch 12  . nitroGLYCERIN (NITROSTAT) 0.4 MG SL tablet Place 1 tablet (0.4 mg total) under the tongue every 5 (five) minutes x 3 doses as needed for chest pain. 25 tablet 3  . Polyethyl Glycol-Propyl Glycol (SYSTANE) 0.4-0.3 % SOLN Apply 1 drop to eye daily as needed (for dry eyes).    . ranitidine (ZANTAC) 150 MG tablet Take 150 mg by mouth daily as needed for heartburn.     . traMADol (ULTRAM) 50 MG tablet Take 50 mg by mouth every 6 (six) hours as needed for moderate pain. Reported on 01/14/2016     No current facility-administered medications for  this visit.    Allergies  Allergen Reactions  . Ciprofloxacin Anaphylaxis  . Erythromycin Nausea And Vomiting  . Morphine And Related Other (See Comments)    Doesn't work for patient at all  . Codeine Other (See Comments)    Unknown reaction  . Penicillins Rash  . Sulfa Antibiotics Rash    On ROS today: urinary urgency and frequency, no rest pain.   Physical Examination  Filed Vitals:   02/12/16 1124  BP: 110/74  Pulse: 76  Height: 5' 4.5" (1.638 m)  Weight: 186 lb (84.369 kg)  SpO2: 96%   Body mass index is 31.45 kg/(m^2).  General: A&O x 3, WD, mildly Obese, appears older than stated age  Pulmonary: Sym exp, good air  movt, CTAB, no rales, rhonchi, & wheezing  Cardiac: RRR, Nl S1, S2, no Murmurs, rubs or gallops  Vascular: Vessel Right Left  Radial Palpable Palpable  Brachial Palpable Palpable  Carotid Palpable, without bruit Palpable, without bruit  Aorta Not palpable N/A  Femoral Faintly Palpable Palpable  Popliteal Not palpable Not palpable  PT Not Palpable Not Palpable  DP Not Palpable Not Palpable   Gastrointestinal: soft, NTND, no G/R, no HSM, no masses, no CVAT B  Musculoskeletal: M/S 5/5 throughout , Extremities without ischemic changes, B feet cyanotic (R>L), no ulcers or gangrene  Neurologic: Pain and light touch intact in extremities, Motor exam as listed above   Medical Decision Making  [name] is a 65 y.o. female who presents with:  bilateral leg intermittent claudication without evidence of critical limb ischemia, recurrent UTI, urinary frequency and urgency, history of CAD s/p PCI   Unfortunately, the long gap in care necessitates re-evaluation of the aortoiliac arterial system.  As I can't stent this patient anyway, would get a CTA to evaluate the aorta's anastomosis also in case an aortobifemoral bypass turns out to be necessary.  Based on the patient's vascular studies and examination, I have offered the patient: preop cardiac risk stratification and CTA abd/pelvis with bilateral runoff.  Prior to proceeding with any intervention, will have to wait on outcome of ID and Urology work-up.   As any bypass would be completed with prosthetic graft, she would be at risk for seeding with any persistent bactermia.  Additionally, the patient has significant history of CAD requiring intervention so preop risk stratification with Cardiology would be essential.  I discussed in depth with the patient the nature of atherosclerosis, and emphasized the importance of maximal medical management including strict control of blood pressure, blood glucose, and lipid levels, antiplatelet  agents, obtaining regular exercise, and cessation of smoking.    The patient is aware that without maximal medical management the underlying atherosclerotic disease process will progress, limiting the benefit of any interventions. The patient is currently on a statin: Lipitor. The patient is currently on an anti-platelet: ASA.  Thank you for allowing [place] to participate in this patient's care.   Adele Barthel, MD, FACS [facility] Office: [phone] Pager: [phone]

## 2016-02-12 ENCOUNTER — Telehealth: Payer: Self-pay | Admitting: Vascular Surgery

## 2016-02-12 ENCOUNTER — Encounter: Payer: Self-pay | Admitting: Vascular Surgery

## 2016-02-12 ENCOUNTER — Ambulatory Visit (INDEPENDENT_AMBULATORY_CARE_PROVIDER_SITE_OTHER): Payer: Medicare Other | Admitting: Vascular Surgery

## 2016-02-12 VITALS — BP 110/74 | HR 76 | Ht 64.5 in | Wt 186.0 lb

## 2016-02-12 DIAGNOSIS — I7409 Other arterial embolism and thrombosis of abdominal aorta: Secondary | ICD-10-CM | POA: Diagnosis not present

## 2016-02-12 DIAGNOSIS — I70213 Atherosclerosis of native arteries of extremities with intermittent claudication, bilateral legs: Secondary | ICD-10-CM | POA: Diagnosis not present

## 2016-02-12 NOTE — Addendum Note (Signed)
Addended by: Thresa Ross C on: 02/12/2016 03:03 PM   Modules accepted: Orders

## 2016-02-12 NOTE — Telephone Encounter (Signed)
Spoke with patient: CTA 02/17/16 at 1:15 pm at [facility] no solids 4 hrs prior. Labs drawn there. Follow up with Dr. Bridgett Larsson 02/18/16  3:45 pm Patient verbalized understanding.

## 2016-02-16 ENCOUNTER — Telehealth: Payer: Self-pay | Admitting: *Deleted

## 2016-02-16 NOTE — Telephone Encounter (Addendum)
Patient called to let Dr. Baxter Flattery know that she is not going to take the Premarin Cream as she received a letter from [facility] stating it can cause heart attack and she has had heart trouble previously. She also called regarding her referral to [facility]. [name] faxed over the referral to [facility] on 01/18/16 and will call to follow up on this referral. Patient is aware that she will be contacted once this appointment has been made. [name]

## 2016-02-16 NOTE — Telephone Encounter (Signed)
[facility] and spoke with Maudie Mercury who said that patient was not in system as a referral. Fax status OK on 05/15. Correct fax number, verified by Maudie Mercury. Telephone referral made. Patient scheduled with Dr. Vikki Ports at [facility] on July 27th at 8:15am.   Called patient to notify of appointment. No answer. Left voicemail with appointment information and [facility] telephone number instructing patient to call if appointment was not good for her. Rodman Key, LPN

## 2016-02-17 ENCOUNTER — Ambulatory Visit
Admission: RE | Admit: 2016-02-17 | Discharge: 2016-02-17 | Disposition: A | Discharge status: Home / Self Care | Payer: Medicare Other | Source: Ambulatory Visit | Attending: Vascular Surgery | Admitting: Vascular Surgery

## 2016-02-17 ENCOUNTER — Other Ambulatory Visit: Payer: Self-pay | Admitting: Vascular Surgery

## 2016-02-17 DIAGNOSIS — I7409 Other arterial embolism and thrombosis of abdominal aorta: Secondary | ICD-10-CM

## 2016-02-17 DIAGNOSIS — I70213 Atherosclerosis of native arteries of extremities with intermittent claudication, bilateral legs: Secondary | ICD-10-CM

## 2016-02-17 MED ORDER — IOPAMIDOL (ISOVUE-370) INJECTION 76%
75.0000 mL | Freq: Once | INTRAVENOUS | Status: AC | PRN
  Administered 2016-02-17: 75 mL via INTRAVENOUS

## 2016-02-18 ENCOUNTER — Encounter: Payer: Self-pay | Admitting: Vascular Surgery

## 2016-02-18 ENCOUNTER — Ambulatory Visit: Payer: Medicare Other | Admitting: Vascular Surgery

## 2016-02-18 NOTE — Progress Notes (Signed)
Established Intermittent Claudication  History of Present Illness  [name] is a 65 y.o. (Feb 14, 1951) female who presents with chief complaint: bilateral leg numbness.  The patient's symptoms have no progressed.  The patient's symptoms are: intermittent radicular pain in both legs and some foot and leg numbness.  This is different from her prior right leg intermittent claudication.  The patient's treatment regimen currently included: maximal medical management.  The patient continues to have urinary frequency and dysuria.  Past Medical History  Diagnosis Date  . Hypertension   . GERD (gastroesophageal reflux disease)   . Hypercholesteremia   . Chronic hip pain   . H/O hiatal hernia   . Shortness of breath     exertional, occ. periods of wheezing -"uses inhaler"  . Anxiety   . Chest discomfort     currently remains with "chest burning" feeling  . Arthritis     neck, shoulders-"thinks all joints"occ.uses cane  . Cataracts, both eyes     surgical, can't afford to have removed.  . Peripheral vascular disease ([place])     "needs to have bypass surgery for leg circulation" toes are cold and tingles occ.  Marland Kitchen CAD (coronary artery disease)   . ST elevation myocardial infarction (STEMI) of inferoposterior wall [facility]) September 2013    S/P DES X 3 RCA, EF 50%  . Complication of anesthesia     laryngospasm when she had her tubal ligation    Past Surgical History  Procedure Laterality Date  . Tonsillectomy    . Appendectomy    . Vulvectomy      "constant inner vaginal burning"- no longer a problem  . Cholecystectomy N/A 12/31/2012    Procedure: LAPAROSCOPIC CHOLECYSTECTOMY;  Surgeon: Harl Bowie, MD;  Location: [facility];  Service: General;  Laterality: N/A;  . Tubal ligation    . Breast surgery      breast augmentation  . Cardiac catheterization      stents x3 " all clogged" states 06-23-14. "has collateral circulation  . Esophagogastroduodenoscopy (egd) with propofol N/A  06/26/2014    Procedure: ESOPHAGOGASTRODUODENOSCOPY (EGD) WITH PROPOFOL;  Surgeon: Juanita Craver, MD;  Location: [facility];  Service: Endoscopy;  Laterality: N/A;  . Colonoscopy with propofol N/A 06/26/2014    Procedure: COLONOSCOPY WITH PROPOFOL;  Surgeon: Juanita Craver, MD;  Location: [facility];  Service: Endoscopy;  Laterality: N/A;  . Left heart catheterization with coronary angiogram N/A 05/24/2012    Procedure: LEFT HEART CATHETERIZATION WITH CORONARY ANGIOGRAM;  Surgeon: Clent Demark, MD;  Location: [facility] CATH LAB;  Service: Cardiovascular;  Laterality: N/A;  . Abdominal aortagram N/A 02/06/2014    Procedure: ABDOMINAL AORTAGRAM;  Surgeon: Conrad Hardwick, MD;  Location: [facility] CATH LAB;  Service: Cardiovascular;  Laterality: N/A;  . Left heart catheterization with coronary angiogram N/A 04/01/2014    Procedure: LEFT HEART CATHETERIZATION WITH CORONARY ANGIOGRAM;  Surgeon: Clent Demark, MD;  Location: [facility] CATH LAB;  Service: Cardiovascular;  Laterality: N/A;  . Cataract extraction      Social History   Social History  . Marital Status: Divorced    Spouse Name: N/A  . Number of Children: N/A  . Years of Education: N/A   Occupational History  . Not on file.   Social History Main Topics  . Smoking status: Current Every Day Smoker -- 0.50 packs/day for 50 years    Types: Cigarettes  . Smokeless tobacco: Never Used     Comment: trying to , occ. uses E  cigs  . Alcohol Use: No  . Drug Use: No  . Sexual Activity: Not on file   Other Topics Concern  . Not on file   Social History Narrative    Family History  Problem Relation Age of Onset  . Hypertension Mother   . Heart disease Mother     before age 28   . Cancer Father   . Diabetes Father   . Hypertension Father   . Hyperlipidemia Father   . Varicose Veins Father   . Heart attack Father   . Hypertension Sister   . Hypertension Brother   . Heart disease Brother     before age 63  . Heart attack Brother   . Hyperlipidemia  Son   . Peripheral vascular disease Son     Current Outpatient Prescriptions  Medication Sig Dispense Refill  . acetaminophen (TYLENOL) 500 MG tablet Take 500-1,000 mg by mouth every 4 (four) hours as needed for pain. Reported on 02/12/2016    . albuterol (PROVENTIL HFA;VENTOLIN HFA) 108 (90 BASE) MCG/ACT inhaler Inhale 2 puffs into the lungs 2 (two) times daily as needed for wheezing or shortness of breath. For wheezing    . amLODipine (NORVASC) 10 MG tablet Take 20 mg by mouth every morning.     Marland Kitchen aspirin 81 MG tablet Take 81 mg by mouth every morning.     Marland Kitchen atorvastatin (LIPITOR) 40 MG tablet Take 1 tablet by mouth daily at 6 PM.     . budesonide-formoterol (SYMBICORT) 160-4.5 MCG/ACT inhaler Inhale 2 puffs into the lungs 2 (two) times daily.     . clonazePAM (KLONOPIN) 2 MG tablet Take 1 mg by mouth 2 (two) times daily. (Some times take another half in between)    . esomeprazole (NEXIUM) 40 MG capsule     . HYDROcodone-acetaminophen (NORCO) 10-325 MG tablet Take 1 tablet by mouth every 6 (six) hours as needed for moderate pain.    Marland Kitchen lisinopril (PRINIVIL,ZESTRIL) 10 MG tablet Take 20 mg by mouth every evening.     . metoprolol tartrate (LOPRESSOR) 25 MG tablet Take 25 mg by mouth 2 (two) times daily.     . nitroGLYCERIN (NITRO-DUR) 0.2 mg/hr patch Place 1 patch (0.2 mg total) onto the skin daily. 30 patch 12  . nitroGLYCERIN (NITROSTAT) 0.4 MG SL tablet Place 1 tablet (0.4 mg total) under the tongue every 5 (five) minutes x 3 doses as needed for chest pain. 25 tablet 3  . Polyethyl Glycol-Propyl Glycol (SYSTANE) 0.4-0.3 % SOLN Apply 1 drop to eye daily as needed (for dry eyes).    . ranitidine (ZANTAC) 150 MG tablet Take 150 mg by mouth daily as needed for heartburn.     . cholestyramine (QUESTRAN) 4 G packet Take 4 g by mouth 2 (two) times daily. Reported on 02/19/2016    . conjugated estrogens (PREMARIN) vaginal cream Place 1 Applicatorful vaginally daily. (Patient not taking: Reported on  02/12/2016) 42.5 g 12  . DULoxetine (CYMBALTA) 30 MG capsule Take 30 mg by mouth 2 (two) times daily. Reported on 02/19/2016    . traMADol (ULTRAM) 50 MG tablet Take 50 mg by mouth every 6 (six) hours as needed for moderate pain. Reported on 02/19/2016     No current facility-administered medications for this visit.     Allergies  Allergen Reactions  . Ciprofloxacin Anaphylaxis  . Erythromycin Nausea And Vomiting  . Morphine And Related Other (See Comments)    Doesn't work for patient at all  .  Codeine Other (See Comments)    Unknown reaction  . Penicillins Rash  . Sulfa Antibiotics Rash    REVIEW OF SYSTEMS:  (Positives checked otherwise negative)  CARDIOVASCULAR:   [ ]  chest pain,  [ ]  chest pressure,  [ ]  palpitations,  [ ]  shortness of breath when laying flat,  [ ]  shortness of breath with exertion,   [ ]  pain in feet when walking,  [ ]  pain in feet when laying flat, [ ]  history of blood clot in veins (DVT),  [ ]  history of phlebitis,  [ ]  swelling in legs,  [ ]  varicose veins  PULMONARY:   [ ]  productive cough,  [ ]  asthma,  [ ]  wheezing  NEUROLOGIC:   [ ]  weakness in arms or legs,  [ ]  numbness in arms or legs,  [ ]  difficulty speaking or slurred speech,  [ ]  temporary loss of vision in one eye,  [ ]  dizziness  HEMATOLOGIC:   [ ]  bleeding problems,  [ ]  problems with blood clotting too easily  MUSCULOSKEL:   [ ]  joint pain, [ ]  joint swelling  GASTROINTEST:   [ ]  vomiting blood,  [ ]  blood in stool     GENITOURINARY:   [ ]  burning with urination,  [ ]  blood in urine  PSYCHIATRIC:   [ ]  history of major depression  INTEGUMENTARY:   [ ]  rashes,  [ ]  ulcers  CONSTITUTIONAL:   [ ]  fever,  [ ]  chills   Physical Examination  Filed Vitals:   02/19/16 1054  BP: 114/72  Pulse: 71  Height: 5' 4.5" (1.638 m)  Weight: 188 lb 9.6 oz (85.548 kg)  SpO2: 98%   Body mass index is 31.88 kg/(m^2).  General: A&O x 3, WD, mildly Obese, appears older  than stated age  Pulmonary: Sym exp, good air movt, CTAB, no rales, rhonchi, & wheezing  Cardiac: RRR, Nl S1, S2, no Murmurs, rubs or gallops  Vascular: Vessel Right Left  Radial Palpable Palpable  Brachial Palpable Palpable  Carotid Palpable, without bruit Palpable, without bruit  Aorta Not palpable N/A  Femoral Faintly Palpable Palpable  Popliteal Not palpable Not palpable  PT Not Palpable Not Palpable  DP Not Palpable Not Palpable   Gastrointestinal: soft, NTND, no G/R, no HSM, no masses, no CVAT B  Musculoskeletal: M/S 5/5 throughout , Extremities without ischemic changes, B feet cyanotic (R>L), no ulcers or gangrene  Neurologic: Pain and light touch intact in extremities, Motor exam as listed above   CTA Abd/Pelvis (02/17/16) 1. Focal high-grade stenosis of the origin of the right common iliacartery. 2. Mild stenosis of the origin of the left common iliac artery. 3. Variant hepatic arterial anatomy incidentally noted.  Based on my review of this patient's CTA, her distal aorta is roughly the size of her left common iliac artery.  There is a <50% stenosis of the L CIA.  There remains a >90% stenosis in her proximal R CIA with continue flow through that artery.     Medical Decision Making  AISHAT WOLLIN is a 65 y.o. female who presents with:  Right intermittent claudication without evidence of critical limb ischemia, R CIA stenosis >90%, L CIA <50%, neuropathic sx suggestive of possible spinal etiology, recurrent UTI   Pt's CTA suggests that left to right femorofemoral bypass with possible left CIA stenting should adequate and aortobifemoral bypass should not be needed.  Pt's Cardiology and Urology appointments are pending.  As part of her  preop clearance, will obtain blood and urine cx.  Cannot proceed with femorofemoral bypass until no evidence of bacteremia.  The patient has family commitments that will limit her ability to schedule in the  next month, so it may be August before she can proceed.  As she was lost to follow up for 2 years without any significant change, I think the delay should be safe.  In regards to her neuropathic sx, further work-up including evaluation for spinal etiology might be necessary.  I discussed in depth with the patient the nature of atherosclerosis, and emphasized the importance of maximal medical management including strict control of blood pressure, blood glucose, and lipid levels, antiplatelet agents, obtaining regular exercise, and cessation of smoking.    The patient is aware that without maximal medical management the underlying atherosclerotic disease process will progress, limiting the benefit of any interventions.  The patient is currently on a statin: Lipitor.  The patient is currently on an anti-platelet: ASA.  Thank you for allowing [place] to participate in this patient's care.   Adele Barthel, MD, FACS [facility] Office: [phone] Pager: [phone]

## 2016-02-19 ENCOUNTER — Ambulatory Visit (INDEPENDENT_AMBULATORY_CARE_PROVIDER_SITE_OTHER): Payer: Medicare Other | Admitting: Vascular Surgery

## 2016-02-19 ENCOUNTER — Encounter: Payer: Self-pay | Admitting: Vascular Surgery

## 2016-02-19 VITALS — BP 114/72 | HR 71 | Ht 64.5 in | Wt 188.6 lb

## 2016-02-19 DIAGNOSIS — I7409 Other arterial embolism and thrombosis of abdominal aorta: Secondary | ICD-10-CM | POA: Diagnosis not present

## 2016-02-19 DIAGNOSIS — I70211 Atherosclerosis of native arteries of extremities with intermittent claudication, right leg: Secondary | ICD-10-CM | POA: Diagnosis not present

## 2016-11-16 ENCOUNTER — Encounter: Payer: Self-pay | Admitting: Neurology

## 2016-11-17 ENCOUNTER — Other Ambulatory Visit: Payer: Self-pay | Admitting: *Deleted

## 2016-11-17 DIAGNOSIS — R0989 Other specified symptoms and signs involving the circulatory and respiratory systems: Secondary | ICD-10-CM

## 2016-11-22 ENCOUNTER — Ambulatory Visit (INDEPENDENT_AMBULATORY_CARE_PROVIDER_SITE_OTHER): Payer: Medicare HMO | Admitting: Neurology

## 2016-11-22 DIAGNOSIS — I739 Peripheral vascular disease, unspecified: Principal | ICD-10-CM

## 2016-11-22 DIAGNOSIS — R0989 Other specified symptoms and signs involving the circulatory and respiratory systems: Secondary | ICD-10-CM | POA: Diagnosis not present

## 2016-11-22 DIAGNOSIS — G63 Polyneuropathy in diseases classified elsewhere: Secondary | ICD-10-CM

## 2016-11-22 NOTE — Procedures (Signed)
[facility]  [address], [place] [address]  [place], [place] [address] Tel: [phone] Fax:  [phone] Test Date:  11/22/2016  Patient: [name] DOB: 1951-01-12 Physician: Narda Amber, DO  Sex: Female Height: 5\' 5"  Ref Phys: Anastasia Pall, M.D.  ID#: [id number] Temp: 31.8C Technician:    Patient Complaints: This is a 66 year-old female with peripheral vascular disease referred for evaluation of bilateral feet paresthesias and pain.  NCV & EMG Findings: Extensive electrodiagnostic testing of the right lower extremity and additional studies of the left shows:  1. Bilateral superficial peroneal sensory responses are absent. Bilateral sural sensory responses are within normal limits. 2. Bilateral peroneal and tibial motor responses are within normal limits. Of note, proximal tibial stimulation popliteal fossa was technically challenging due to body habitus. 3. Right tibial H reflex study is within normal limits. 4. Sparse chronic motor axon loss changes are seen affecting bilateral flexor digitorum longus muscles, without accompanied active denervation.  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  Overall, these findings are mild in degree electrically.   ___________________________ Narda Amber, DO    Nerve Conduction Studies Anti Sensory Summary Table   Site NR Peak (ms) Norm Peak (ms) P-T Amp (V) Norm P-T Amp  Left Sup Peroneal Anti Sensory (Ant Lat Mall)  12 cm NR  <4.6  >3  Right Sup Peroneal Anti Sensory (Ant Lat Mall)  12 cm NR  <4.6  >3  Left Sural Anti Sensory (Lat Mall)  Calf    3.8 <4.6 11.0 >3  Right Sural Anti Sensory (Lat Mall)  Site 2    4.2  9.9    Motor Summary Table   Site NR Onset (ms) Norm Onset (ms) O-P Amp (mV) Norm O-P Amp Site1 Site2 Delta-0 (ms) Dist (cm) Vel (m/s) Norm Vel (m/s)  Left Peroneal Motor (Ext Dig Brev)    Ankle    4.1 <6.0 3.0 >2.5 B Fib Ankle 7.2 33.0 46 >40  B Fib    11.3  2.9  Poplt B Fib 1.9 8.0 42 >40  Poplt    13.2  3.0         Right Peroneal Motor (Ext Dig Brev)  Ankle    4.8 <6.0 4.9 >2.5 B Fib Ankle 6.8 35.0 51 >40  B Fib    11.6  4.9  Poplt B Fib 1.1 8.0 73 >40  Poplt    12.7  4.4         Left Tibial Motor (Abd Hall Brev)    body habitus behind knee  Ankle    3.8 <6.0 6.4 >4 Knee Ankle 10.0 42.0 42 >40  Knee    13.8  0.0         Right Tibial Motor (Abd Hall Brev)  Ankle    5.0 <6.0 4.7 >4 Knee Ankle 9.5 39.0 41 >40  Knee    14.5  2.6          H Reflex Studies   NR H-Lat (ms) Lat Norm (ms) L-R H-Lat (ms)  Right Tibial (Gastroc)     34.69 <35    EMG   Side Muscle Ins Act Fibs Psw Fasc Number Recrt Dur Dur. Amp Amp. Poly Poly. Comment  Right AntTibialis Nml Nml Nml Nml Nml Nml Nml Nml Nml Nml Nml Nml N/A  Right Flex Dig Long Nml Nml Nml Nml 1- Mod-R Few 1+ Few 1+ Nml Nml N/A  Right  Gastroc Nml Nml Nml Nml Nml Nml Nml Nml Nml Nml Nml Nml N/A  Right RectFemoris Nml Nml Nml Nml Nml Nml Nml Nml Nml Nml Nml Nml N/A  Right GluteusMed Nml Nml Nml Nml Nml Nml Nml Nml Nml Nml Nml Nml N/A  Right BicepsFemS Nml Nml Nml Nml Nml Nml Nml Nml Nml Nml Nml Nml N/A  Left AntTibialis Nml Nml Nml Nml Nml Nml Nml Nml Nml Nml Nml Nml N/A  Left Gastroc Nml Nml Nml Nml Nml Nml Nml Nml Nml Nml Nml Nml N/A  Left Flex Dig Long Nml Nml Nml Nml 1- Mod-R Few 1+ Few 1+ Nml Nml N/A      Waveforms:
